# Patient Record
Sex: Female | Born: 1975 | Race: White | Hispanic: No | Marital: Married | Smoking: Never smoker
Health system: Southern US, Community
[De-identification: ages and names within clinical notes are randomized; demographics above are authoritative.]

## PROBLEM LIST (undated history)

## (undated) DIAGNOSIS — B351 Tinea unguium: Secondary | ICD-10-CM

## (undated) DIAGNOSIS — I10 Essential (primary) hypertension: Secondary | ICD-10-CM

## (undated) DIAGNOSIS — E785 Hyperlipidemia, unspecified: Secondary | ICD-10-CM

## (undated) HISTORY — DX: Essential (primary) hypertension: I10

## (undated) HISTORY — DX: Tinea unguium: B35.1

## (undated) HISTORY — DX: Hyperlipidemia, unspecified: E78.5

---

## 2001-12-22 ENCOUNTER — Other Ambulatory Visit: Admission: RE | Admit: 2001-12-22 | Discharge: 2001-12-22 | Payer: Self-pay | Admitting: Obstetrics and Gynecology

## 2004-03-20 ENCOUNTER — Other Ambulatory Visit: Admission: RE | Admit: 2004-03-20 | Discharge: 2004-03-20 | Payer: Self-pay | Admitting: Obstetrics and Gynecology

## 2011-01-04 LAB — HM PAP SMEAR: HM Pap smear: NORMAL

## 2011-04-06 ENCOUNTER — Encounter: Payer: Self-pay | Admitting: Internal Medicine

## 2011-04-06 ENCOUNTER — Ambulatory Visit (INDEPENDENT_AMBULATORY_CARE_PROVIDER_SITE_OTHER): Payer: BC Managed Care – PPO | Admitting: Internal Medicine

## 2011-04-06 VITALS — BP 120/84 | HR 80 | Temp 98.6°F | Ht 64.0 in | Wt 147.0 lb

## 2011-04-06 DIAGNOSIS — L708 Other acne: Secondary | ICD-10-CM

## 2011-04-06 DIAGNOSIS — I10 Essential (primary) hypertension: Secondary | ICD-10-CM | POA: Insufficient documentation

## 2011-04-06 DIAGNOSIS — L709 Acne, unspecified: Secondary | ICD-10-CM | POA: Insufficient documentation

## 2011-04-06 DIAGNOSIS — R197 Diarrhea, unspecified: Secondary | ICD-10-CM

## 2011-04-06 DIAGNOSIS — E785 Hyperlipidemia, unspecified: Secondary | ICD-10-CM | POA: Insufficient documentation

## 2011-04-06 LAB — CBC WITH DIFFERENTIAL/PLATELET
Basophils Absolute: 0 10*3/uL (ref 0.0–0.1)
Basophils Relative: 0 % (ref 0–1)
Eosinophils Absolute: 0.1 10*3/uL (ref 0.0–0.7)
Eosinophils Relative: 2 % (ref 0–5)
HCT: 39.7 % (ref 36.0–46.0)
Hemoglobin: 13.9 g/dL (ref 12.0–15.0)
Lymphocytes Relative: 39 % (ref 12–46)
Lymphs Abs: 2.7 10*3/uL (ref 0.7–4.0)
MCH: 31.4 pg (ref 26.0–34.0)
MCHC: 35 g/dL (ref 30.0–36.0)
MCV: 89.6 fL (ref 78.0–100.0)
Monocytes Absolute: 0.5 10*3/uL (ref 0.1–1.0)
Monocytes Relative: 7 % (ref 3–12)
Neutro Abs: 3.7 10*3/uL (ref 1.7–7.7)
Neutrophils Relative %: 52 % (ref 43–77)
Platelets: 235 10*3/uL (ref 150–400)
RBC: 4.43 MIL/uL (ref 3.87–5.11)
RDW: 12 % (ref 11.5–15.5)
WBC: 7 10*3/uL (ref 4.0–10.5)

## 2011-04-06 LAB — COMPREHENSIVE METABOLIC PANEL
ALT: 18 U/L (ref 0–35)
AST: 27 U/L (ref 0–37)
Albumin: 4.8 g/dL (ref 3.5–5.2)
Alkaline Phosphatase: 48 U/L (ref 39–117)
BUN: 15 mg/dL (ref 6–23)
CO2: 22 mEq/L (ref 19–32)
Calcium: 9.4 mg/dL (ref 8.4–10.5)
Chloride: 101 mEq/L (ref 96–112)
Creat: 0.89 mg/dL (ref 0.50–1.10)
Glucose, Bld: 75 mg/dL (ref 70–99)
Potassium: 3.4 mEq/L — ABNORMAL LOW (ref 3.5–5.3)
Sodium: 137 mEq/L (ref 135–145)
Total Bilirubin: 1.5 mg/dL — ABNORMAL HIGH (ref 0.3–1.2)
Total Protein: 7.2 g/dL (ref 6.0–8.3)

## 2011-04-06 LAB — LIPID PANEL
Cholesterol: 187 mg/dL (ref 0–200)
HDL: 72 mg/dL (ref >39)
LDL Cholesterol: 100 mg/dL — ABNORMAL HIGH (ref 0–99)
Total CHOL/HDL Ratio: 2.6 Ratio
Triglycerides: 75 mg/dL (ref <150)
VLDL: 15 mg/dL (ref 0–40)

## 2011-04-06 MED ORDER — MINOCYCLINE HCL 100 MG PO CAPS: 100.0000 mg | ORAL_CAPSULE | Freq: Every day | ORAL | Status: DC

## 2011-04-06 NOTE — Assessment & Plan Note (Signed)
Patient is on minocycline. Reports good control at this medication. Will continue.

## 2011-04-06 NOTE — Assessment & Plan Note (Signed)
Patient is on hydrochlorothiazide. Blood pressure well-controlled today. We'll check renal function with labs. Followup one month.

## 2011-04-06 NOTE — Assessment & Plan Note (Signed)
Symptoms of diarrhea times one year. No findings such as blood in the stool or abdominal pain to suggest Crohn's or other inflammatory bowel disease. No fever, chills or other symptoms to suggest infectious origin. Suspect IBS. Will send stool for culture. Will send CMP, CBC, and celiac panel. Patient will followup in one month. If lap testing is negative would favor sending her for colonoscopy for further evaluation.

## 2011-04-06 NOTE — Assessment & Plan Note (Signed)
Patient is on Lipitor and Niaspan. Will check lipids and LFTs with labs today. Followup one month.

## 2011-04-06 NOTE — Progress Notes (Signed)
Subjective:    Patient ID: Heather [name], female    DOB: 01-02-1976, 36 y.o.   MRN: [id number]  HPI 36 year old female with history of hypertension and hyperlipidemia presents to establish care. Her primary concern today is a 1 year history of watery diarrhea. She reports that she has episodes of diarrhea on a daily basis. She has not been able to determine a food triggers that leads to these episodes. She describes cramping that occurs before the diarrhea. She denies any blood in her stool. She denies any rectal pain. She denies any nausea or vomiting. She denies any fever, chills. She has not experienced weight loss with her diarrhea. She has never had colonoscopy or other evaluation.  In regards to her history of hypertension hyperlipidemia she reports full compliance with her medications. She denies any noted side effects from the medicines. She notes that she is due to have her cholesterol rechecked.  In regards to history of acne, she reports good control of her symptoms with the use of minocycline. She would like to continue this medication.  Outpatient Encounter Prescriptions as of 04/06/2011  Medication Sig Dispense Refill  . atorvastatin (LIPITOR) 20 MG tablet Take 20 mg by mouth daily.      . Calcium Carbonate (CALTRATE 600 PO) Take by mouth daily.      Marland Kitchen FLUoxetine (PROZAC) 20 MG capsule Take 20 mg by mouth. Daily prior to mensus      . hydrochlorothiazide (HYDRODIURIL) 25 MG tablet Take 25 mg by mouth daily.      . minocycline (MINOCIN,DYNACIN) 100 MG capsule Take 1 capsule (100 mg total) by mouth daily.  90 capsule  1  . niacin (NIASPAN) 500 MG CR tablet Take 500 mg by mouth at bedtime.      Marland Kitchen DISCONTD: minocycline (MINOCIN,DYNACIN) 100 MG capsule Take 100 mg by mouth daily.        Review of Systems  Constitutional: Negative for fever, chills, appetite change, fatigue and unexpected weight change.  HENT: Negative for ear pain, congestion, sore throat, trouble swallowing,  neck pain, voice change and sinus pressure.   Eyes: Negative for visual disturbance.  Respiratory: Negative for cough, shortness of breath, wheezing and stridor.   Cardiovascular: Negative for chest pain, palpitations and leg swelling.  Gastrointestinal: Positive for abdominal pain and diarrhea. Negative for nausea, vomiting, constipation, blood in stool, abdominal distention and anal bleeding.  Genitourinary: Negative for dysuria and flank pain.  Musculoskeletal: Negative for myalgias, arthralgias and gait problem.  Skin: Positive for rash. Negative for color change.  Neurological: Negative for dizziness and headaches.  Hematological: Negative for adenopathy. Does not bruise/bleed easily.  Psychiatric/Behavioral: Negative for suicidal ideas, sleep disturbance and dysphoric mood. The patient is not nervous/anxious.    BP 120/84  Pulse 80  Temp 98.6 F (37 C)  Ht 5\' 4"  (1.626 m)  Wt 147 lb (66.679 kg)  BMI 25.23 kg/m2  SpO2 100%  LMP 03/04/2011     Objective:   Physical Exam  Constitutional: She is oriented to person, place, and time. She appears well-developed and well-nourished. No distress.  HENT:  Head: Normocephalic and atraumatic.  Right Ear: External ear normal.  Left Ear: External ear normal.  [name]: [name] normal.  Mouth/Throat: Oropharynx is clear and moist. No oropharyngeal exudate.  Eyes: Conjunctivae are normal. Pupils are equal, round, and reactive to light. Right eye exhibits no discharge. Left eye exhibits no discharge. No scleral icterus.  Neck: Normal range of motion. Neck supple. No tracheal  deviation present. No thyromegaly present.  Cardiovascular: Normal rate, regular rhythm, normal heart sounds and intact distal pulses.  Exam reveals no gallop and no friction rub.   No murmur heard. Pulmonary/Chest: Effort normal and breath sounds normal. No respiratory distress. She has no wheezes. She has no rales. She exhibits no tenderness.  Abdominal: Soft. Bowel sounds  are normal. She exhibits no distension and no mass. There is no tenderness. There is no rebound and no guarding.  Musculoskeletal: Normal range of motion. She exhibits no edema and no tenderness.  Lymphadenopathy:    She has no cervical adenopathy.  Neurological: She is alert and oriented to person, place, and time. No cranial nerve deficit. She exhibits normal muscle tone. Coordination normal.  Skin: Skin is warm and dry. No rash noted. She is not diaphoretic. No erythema. No pallor.  Psychiatric: She has a normal mood and affect. Her behavior is normal. Judgment and thought content normal.          Assessment & Plan:

## 2011-04-07 ENCOUNTER — Encounter: Payer: Self-pay | Admitting: Internal Medicine

## 2011-04-07 DIAGNOSIS — R17 Unspecified jaundice: Secondary | ICD-10-CM

## 2011-04-07 DIAGNOSIS — R197 Diarrhea, unspecified: Secondary | ICD-10-CM

## 2011-04-09 LAB — GLIADIN ANTIBODIES, SERUM
Gliadin IgA: 40.8 U/mL — ABNORMAL HIGH (ref <20)
Gliadin IgG: 3.7 U/mL (ref <20)

## 2011-04-09 NOTE — Progress Notes (Signed)
Addended by: Baldomero Lamy on: 04/09/2011 01:12 PM   Modules accepted: Orders

## 2011-04-10 ENCOUNTER — Encounter: Payer: Self-pay | Admitting: Internal Medicine

## 2011-04-10 DIAGNOSIS — R197 Diarrhea, unspecified: Secondary | ICD-10-CM

## 2011-04-10 LAB — RETICULIN ANTIBODIES, IGA W TITER: Reticulin Ab, IgA: NEGATIVE

## 2011-04-13 ENCOUNTER — Ambulatory Visit: Payer: Self-pay | Admitting: Internal Medicine

## 2011-04-13 LAB — STOOL CULTURE

## 2011-04-17 ENCOUNTER — Telehealth: Payer: Self-pay | Admitting: Internal Medicine

## 2011-04-17 DIAGNOSIS — N1330 Unspecified hydronephrosis: Secondary | ICD-10-CM

## 2011-04-17 NOTE — Telephone Encounter (Signed)
US abdomen showed no abnormality of gallbladder, pancreas or liver.  They did note some fluid around the left kidney.  This is likely a chronic finding, however I would like to get renal US to make sure no abnormality of the ureter or bladder.

## 2011-04-18 ENCOUNTER — Encounter: Payer: Self-pay | Admitting: Internal Medicine

## 2011-04-18 NOTE — Telephone Encounter (Signed)
Patient informed, she is scheduled for u/s tomorrow

## 2011-04-19 ENCOUNTER — Ambulatory Visit: Payer: Self-pay | Admitting: Internal Medicine

## 2011-04-20 ENCOUNTER — Encounter: Payer: Self-pay | Admitting: Internal Medicine

## 2011-04-22 ENCOUNTER — Telehealth: Payer: Self-pay | Admitting: Internal Medicine

## 2011-04-22 NOTE — Telephone Encounter (Signed)
Renal U/S normal

## 2011-04-27 ENCOUNTER — Encounter: Payer: Self-pay | Admitting: Internal Medicine

## 2011-04-28 ENCOUNTER — Encounter: Payer: Self-pay | Admitting: Internal Medicine

## 2011-05-09 ENCOUNTER — Ambulatory Visit: Payer: BC Managed Care – PPO | Admitting: Internal Medicine

## 2011-05-11 ENCOUNTER — Ambulatory Visit (INDEPENDENT_AMBULATORY_CARE_PROVIDER_SITE_OTHER): Payer: BC Managed Care – PPO | Admitting: Internal Medicine

## 2011-05-11 ENCOUNTER — Encounter: Payer: Self-pay | Admitting: Internal Medicine

## 2011-05-11 ENCOUNTER — Telehealth: Payer: Self-pay | Admitting: Internal Medicine

## 2011-05-11 DIAGNOSIS — G43909 Migraine, unspecified, not intractable, without status migrainosus: Secondary | ICD-10-CM

## 2011-05-11 MED ORDER — ELETRIPTAN HYDROBROMIDE 40 MG PO TABS: 40.0000 mg | ORAL_TABLET | ORAL | Status: DC | PRN

## 2011-05-11 NOTE — Assessment & Plan Note (Signed)
Symptoms consistent with migraine. Would like to give IM Phenergan, however patient drove herself today and does not have other transportation home. Will try adding Relpax. Sample given and prescription called in. Patient will call this afternoon if there is no improvement.

## 2011-05-11 NOTE — Progress Notes (Signed)
  Subjective:    Patient ID: Heather [name], female    DOB: 1976-02-10, 36 y.o.   MRN: [id number]  HPI 36 year old female presents for acute visit complaining of headache. She notes that the headache started yesterday. It is located on the right side of her face, mostly around her right eye. It is consistent with her previous migraines which typically occur before her menses. The headache is associated with phono and photophobia. It is also associated with nausea. She has not vomited. Over the last 2 days, she has taken ibuprofen, Aleve, BC powder, and Tylenol with no improvement. She has never used other medications for headache in the past.  Outpatient Encounter Prescriptions as of 05/11/2011  Medication Sig Dispense Refill  . atorvastatin (LIPITOR) 20 MG tablet Take 20 mg by mouth daily.      . Calcium Carbonate (CALTRATE 600 PO) Take by mouth daily.      Marland Kitchen FLUoxetine (PROZAC) 20 MG capsule Take 20 mg by mouth. Daily prior to mensus      . hydrochlorothiazide (HYDRODIURIL) 25 MG tablet Take 25 mg by mouth daily.      . metoprolol succinate (TOPROL-XL) 50 MG 24 hr tablet Take 50 mg by mouth daily. Take with or immediately following a meal.      . minocycline (MINOCIN,DYNACIN) 100 MG capsule Take 1 capsule (100 mg total) by mouth daily.  90 capsule  1  . niacin (NIASPAN) 500 MG CR tablet Take 500 mg by mouth at bedtime.      Marland Kitchen eletriptan (RELPAX) 40 MG tablet One tablet by mouth as needed for migraine headache.  If the headache improves and then returns, dose may be repeated after 2 hours have elapsed since first dose (do not exceed 80 mg per day). may repeat in 2 hours if necessary  10 tablet  0    Review of Systems  Constitutional: Positive for fatigue. Negative for fever and chills.  HENT: Negative for neck pain.   Eyes: Positive for photophobia.  Respiratory: Negative for shortness of breath.   Cardiovascular: Negative for chest pain and palpitations.  Gastrointestinal: Positive for  nausea. Negative for vomiting and abdominal pain.  Neurological: Positive for headaches.   BP 120/70  Pulse 73  Temp(Src) 98 F (36.7 C) (Oral)  Wt 146 lb (66.225 kg)  SpO2 100%     Objective:   Physical Exam  Constitutional: She is oriented to person, place, and time. She appears well-developed and well-nourished. No distress.  HENT:  Head: Normocephalic and atraumatic.  Right Ear: External ear normal.  Left Ear: External ear normal.  [name]: [name] normal.  Mouth/Throat: Oropharynx is clear and moist.  Eyes: Conjunctivae are normal. Pupils are equal, round, and reactive to light. Right eye exhibits no discharge. Left eye exhibits no discharge. No scleral icterus.  Neck: Normal range of motion. Neck supple. No tracheal deviation present. No thyromegaly present.  Pulmonary/Chest: Effort normal.  Musculoskeletal: Normal range of motion. She exhibits no edema and no tenderness.  Lymphadenopathy:    She has no cervical adenopathy.  Neurological: She is alert and oriented to person, place, and time. No cranial nerve deficit. She exhibits normal muscle tone. Coordination normal.  Skin: Skin is warm and dry. No rash noted. She is not diaphoretic. No erythema. No pallor.  Psychiatric: She has a normal mood and affect. Her behavior is normal. Judgment and thought content normal.          Assessment & Plan:

## 2011-05-11 NOTE — Telephone Encounter (Signed)
Follow up.

## 2011-06-20 ENCOUNTER — Ambulatory Visit (INDEPENDENT_AMBULATORY_CARE_PROVIDER_SITE_OTHER): Payer: BC Managed Care – PPO | Admitting: Internal Medicine

## 2011-06-20 ENCOUNTER — Encounter: Payer: Self-pay | Admitting: Internal Medicine

## 2011-06-20 VITALS — BP 122/72 | HR 101 | Temp 98.3°F | Resp 16 | Wt 147.0 lb

## 2011-06-20 DIAGNOSIS — R197 Diarrhea, unspecified: Secondary | ICD-10-CM

## 2011-06-20 DIAGNOSIS — G43909 Migraine, unspecified, not intractable, without status migrainosus: Secondary | ICD-10-CM

## 2011-06-20 DIAGNOSIS — Z87898 Personal history of other specified conditions: Secondary | ICD-10-CM | POA: Insufficient documentation

## 2011-06-20 DIAGNOSIS — R635 Abnormal weight gain: Secondary | ICD-10-CM

## 2011-06-20 NOTE — Progress Notes (Signed)
Subjective:    Patient ID: Heather [name], female    DOB: 09-12-1975, 36 y.o.   MRN: [id number]  HPI 36 year old female with history of chronic diarrhea and migraines presents for followup. In regards to her diarrhea, she reports her symptoms completely resolved with removing gluten from her diet. She was seen by GI physician and had endoscopy. Biopsies are pending. She denies any current abdominal pain, nausea, diarrhea.  In regards to her migraines, she reports complete resolution of symptoms after using Relpax. Since her last visit, she has not had any recurrence of migraine headaches.  She is concerned today about difficulty losing weight. She reports she is counting her calories and exercising up to twice daily with 45 minutes spent on the treadmill or elliptical followed by circuit training. She notes some intolerance to cold temperatures. She also occasionally now has constipation.  Outpatient Encounter Prescriptions as of 06/20/2011  Medication Sig Dispense Refill  . atorvastatin (LIPITOR) 20 MG tablet Take 20 mg by mouth daily.      . Calcium Carbonate (CALTRATE 600 PO) Take by mouth daily.      Marland Kitchen eletriptan (RELPAX) 40 MG tablet One tablet by mouth as needed for migraine headache.  If the headache improves and then returns, dose may be repeated after 2 hours have elapsed since first dose (do not exceed 80 mg per day). may repeat in 2 hours if necessary  10 tablet  0  . FLUoxetine (PROZAC) 20 MG capsule Take 20 mg by mouth. Daily prior to mensus      . hydrochlorothiazide (HYDRODIURIL) 25 MG tablet Take 25 mg by mouth daily.      . metoprolol succinate (TOPROL-XL) 50 MG 24 hr tablet Take 50 mg by mouth daily. Take with or immediately following a meal.      . minocycline (MINOCIN,DYNACIN) 100 MG capsule Take 1 capsule (100 mg total) by mouth daily.  90 capsule  1  . niacin (NIASPAN) 500 MG CR tablet Take 500 mg by mouth at bedtime.       BP 122/72  Pulse 101  Temp(Src) 98.3 F (36.8  C) (Oral)  Resp 16  Wt 147 lb (66.679 kg)  SpO2 94%  LMP 05/23/2011  Review of Systems  Constitutional: Negative for fever, chills, appetite change, fatigue and unexpected weight change.  HENT: Negative for ear pain, congestion, sore throat, trouble swallowing, neck pain, voice change and sinus pressure.   Eyes: Negative for visual disturbance.  Respiratory: Negative for cough, shortness of breath, wheezing and stridor.   Cardiovascular: Negative for chest pain, palpitations and leg swelling.  Gastrointestinal: Positive for diarrhea and constipation. Negative for nausea, vomiting, abdominal pain, blood in stool, abdominal distention and anal bleeding.  Genitourinary: Negative for dysuria and flank pain.  Musculoskeletal: Negative for myalgias, arthralgias and gait problem.  Skin: Negative for color change and rash.  Neurological: Negative for dizziness and headaches.  Hematological: Negative for adenopathy. Does not bruise/bleed easily.  Psychiatric/Behavioral: Negative for suicidal ideas, sleep disturbance and dysphoric mood. The patient is not nervous/anxious.        Objective:   Physical Exam  Constitutional: She is oriented to person, place, and time. She appears well-developed and well-nourished. No distress.  HENT:  Head: Normocephalic and atraumatic.  Right Ear: External ear normal.  Left Ear: External ear normal.  [name]: [name] normal.  Mouth/Throat: Oropharynx is clear and moist. No oropharyngeal exudate.  Eyes: Conjunctivae are normal. Pupils are equal, round, and reactive to light. Right eye  exhibits no discharge. Left eye exhibits no discharge. No scleral icterus.  Neck: Normal range of motion. Neck supple. No tracheal deviation present. No thyromegaly present.  Cardiovascular: Normal rate, regular rhythm, normal heart sounds and intact distal pulses.  Exam reveals no gallop and no friction rub.   No murmur heard. Pulmonary/Chest: Effort normal and breath sounds normal.  No respiratory distress. She has no wheezes. She has no rales. She exhibits no tenderness.  Abdominal: Soft. Bowel sounds are normal. She exhibits no distension and no mass. There is no tenderness. There is no guarding.  Musculoskeletal: Normal range of motion. She exhibits no edema and no tenderness.  Lymphadenopathy:    She has no cervical adenopathy.  Neurological: She is alert and oriented to person, place, and time. No cranial nerve deficit. She exhibits normal muscle tone. Coordination normal.  Skin: Skin is warm and dry. No rash noted. She is not diaphoretic. No erythema. No pallor.  Psychiatric: She has a normal mood and affect. Her behavior is normal. Judgment and thought content normal.          Assessment & Plan:

## 2011-06-20 NOTE — Assessment & Plan Note (Signed)
Symptoms resolved with removing gluten from diet. Biopsy after endoscopy is pending. Patient will e-mail with update when biopsy results are back.

## 2011-06-20 NOTE — Assessment & Plan Note (Signed)
Significant improvement with use of Relpax. No further episodes since last visit. We'll continue to monitor.

## 2011-06-20 NOTE — Assessment & Plan Note (Signed)
Patient reports difficulty losing weight despite counting calories and exercising regularly. Will check thyroid function with labs today.

## 2011-06-21 LAB — TSH: TSH: 1.27 u[IU]/mL (ref 0.35–5.50)

## 2011-06-26 ENCOUNTER — Encounter: Payer: Self-pay | Admitting: Internal Medicine

## 2011-07-18 ENCOUNTER — Encounter: Payer: Self-pay | Admitting: Internal Medicine

## 2011-08-18 ENCOUNTER — Encounter: Payer: Self-pay | Admitting: Internal Medicine

## 2011-09-12 ENCOUNTER — Other Ambulatory Visit: Payer: Self-pay | Admitting: Internal Medicine

## 2011-10-03 ENCOUNTER — Other Ambulatory Visit: Payer: Self-pay | Admitting: Internal Medicine

## 2011-10-04 ENCOUNTER — Other Ambulatory Visit: Payer: Self-pay | Admitting: *Deleted

## 2011-10-04 MED ORDER — HYDROCHLOROTHIAZIDE 25 MG PO TABS: 25.0000 mg | ORAL_TABLET | Freq: Every day | ORAL | Status: DC

## 2011-11-22 ENCOUNTER — Encounter: Payer: Self-pay | Admitting: Internal Medicine

## 2011-11-22 ENCOUNTER — Ambulatory Visit (INDEPENDENT_AMBULATORY_CARE_PROVIDER_SITE_OTHER): Payer: BC Managed Care – PPO | Admitting: Internal Medicine

## 2011-11-22 VITALS — BP 120/80 | HR 60 | Temp 98.2°F | Ht 64.0 in | Wt 144.5 lb

## 2011-11-22 DIAGNOSIS — M5430 Sciatica, unspecified side: Secondary | ICD-10-CM

## 2011-11-22 DIAGNOSIS — M5431 Sciatica, right side: Secondary | ICD-10-CM | POA: Insufficient documentation

## 2011-11-22 MED ORDER — PREDNISONE (PAK) 10 MG PO TABS: ORAL_TABLET | ORAL | Status: DC

## 2011-11-22 MED ORDER — HYDROCODONE-ACETAMINOPHEN 5-500 MG PO TABS: 1.0000 | ORAL_TABLET | Freq: Three times a day (TID) | ORAL | Status: DC | PRN

## 2011-11-22 NOTE — Assessment & Plan Note (Signed)
Symptoms consistent with right sciatic pain. Will treat with prednisone taper and hydrocodone as needed for severe pain. Patient will call if symptoms persistent over the next 48 hours. If symptoms are persisting, would favor referral to orthopedic surgery or sports medicine for local steroid injection.

## 2011-11-22 NOTE — Progress Notes (Signed)
  Subjective:    Patient ID: Heather [name], female    DOB: 06-Jun-1975, 36 y.o.   MRN: [id number]  HPI 36 year old female presents for acute visit complaining of severe right lower back and hip pain radiating to the back of her right leg. Symptoms first began 2 days ago after using a new machine at her gym. She tried using muscle relaxers she had at home from previous episode of right sciatic pain. She reports no improvement with this. Pain is described as severe and it limits ability to ambulate. She denies weakness in her right leg. She denies fever or chills. She denies swelling in her leg.  Outpatient Encounter Prescriptions as of 11/22/2011  Medication Sig Dispense Refill  . atorvastatin (LIPITOR) 20 MG tablet Take 20 mg by mouth daily.      . Calcium Carbonate (CALTRATE 600 PO) Take by mouth daily.      Marland Kitchen eletriptan (RELPAX) 40 MG tablet One tablet by mouth as needed for migraine headache.  If the headache improves and then returns, dose may be repeated after 2 hours have elapsed since first dose (do not exceed 80 mg per day). may repeat in 2 hours if necessary  10 tablet  0  . FLUoxetine (PROZAC) 20 MG capsule Take 20 mg by mouth. Daily prior to mensus      . hydrochlorothiazide (HYDRODIURIL) 25 MG tablet Take 1 tablet (25 mg total) by mouth daily.  30 tablet  6  . metoprolol succinate (TOPROL-XL) 50 MG 24 hr tablet TAKE 1 TABELT BY MOUTH EVERY DAY  30 tablet  6  . minocycline (MINOCIN,DYNACIN) 100 MG capsule TAKE 1 CAPSULE (100 MG TOTAL) BY MOUTH DAILY.  90 capsule  1  . niacin (NIASPAN) 500 MG CR tablet Take 500 mg by mouth at bedtime.      Marland Kitchen HYDROcodone-acetaminophen (VICODIN) 5-500 MG per tablet Take 1 tablet by mouth every 8 (eight) hours as needed for pain.  60 tablet  1  . predniSONE (STERAPRED UNI-PAK) 10 MG tablet Take 60mg  day 1 then taper by 10mg  daily  21 tablet  0   BP 120/80  Pulse 60  Temp 98.2 F (36.8 C) (Oral)  Ht 5\' 4"  (1.626 m)  Wt 144 lb 8 oz (65.545 kg)  BMI  24.80 kg/m2  SpO2 98%  LMP 11/13/2011  Review of Systems  Constitutional: Negative for fever, chills and fatigue.  Respiratory: Negative for shortness of breath.   Cardiovascular: Negative for chest pain.  Gastrointestinal: Negative for abdominal pain.  Musculoskeletal: Positive for myalgias and arthralgias. Negative for joint swelling.       Objective:   Physical Exam  Constitutional: She is oriented to person, place, and time. She appears well-developed and well-nourished. No distress.  HENT:  Head: Normocephalic and atraumatic.  Eyes: Pupils are equal, round, and reactive to light.  Neck: Normal range of motion.  Pulmonary/Chest: Effort normal.  Musculoskeletal:       Right hip: She exhibits decreased range of motion and tenderness.       Legs: Neurological: She is alert and oriented to person, place, and time. She has normal reflexes.  Skin: She is not diaphoretic.  Psychiatric: She has a normal mood and affect. Her behavior is normal. Judgment and thought content normal.          Assessment & Plan:

## 2011-12-09 ENCOUNTER — Other Ambulatory Visit: Payer: Self-pay | Admitting: Internal Medicine

## 2012-02-28 ENCOUNTER — Encounter: Payer: Self-pay | Admitting: Internal Medicine

## 2012-02-28 ENCOUNTER — Ambulatory Visit (INDEPENDENT_AMBULATORY_CARE_PROVIDER_SITE_OTHER): Payer: BC Managed Care – PPO | Admitting: Adult Health

## 2012-02-28 ENCOUNTER — Encounter: Payer: Self-pay | Admitting: Adult Health

## 2012-02-28 VITALS — BP 100/70 | HR 64 | Temp 98.8°F | Resp 14 | Ht 64.0 in | Wt 147.8 lb

## 2012-02-28 DIAGNOSIS — H60399 Other infective otitis externa, unspecified ear: Secondary | ICD-10-CM

## 2012-02-28 DIAGNOSIS — H6090 Unspecified otitis externa, unspecified ear: Secondary | ICD-10-CM

## 2012-02-28 DIAGNOSIS — J329 Chronic sinusitis, unspecified: Secondary | ICD-10-CM

## 2012-02-28 MED ORDER — CIPROFLOXACIN-DEXAMETHASONE 0.3-0.1 % OT SUSP: 4.0000 [drp] | Freq: Two times a day (BID) | OTIC | Status: DC

## 2012-02-28 MED ORDER — ANTIPYRINE-BENZOCAINE 5.4-1.4 % OT SOLN: 3.0000 [drp] | OTIC | Status: DC | PRN

## 2012-02-28 MED ORDER — FLUTICASONE PROPIONATE 50 MCG/ACT NA SUSP: 2.0000 | Freq: Every day | NASAL | Status: DC

## 2012-02-28 NOTE — Progress Notes (Signed)
  Subjective:    Patient ID: Heather [name], female    DOB: October 27, 1975, 37 y.o.   MRN: [id number]  HPI  Patient presents to clinic with 2 day history of sinus pressure, mainly on the right side and right ear pain. Secretions are clear. She denies fever, chills. Reports cough started this morning. Pt has tried several OTC products (Musinex, AlkaSeltzer Severe Sinus) without any relief.  Current Outpatient Prescriptions on File Prior to Visit  Medication Sig Dispense Refill  . atorvastatin (LIPITOR) 20 MG tablet TAKE 1 TABLET BY MOUTH AT BEDTIME  30 tablet  3  . Calcium Carbonate (CALTRATE 600 PO) Take by mouth daily.      Marland Kitchen eletriptan (RELPAX) 40 MG tablet One tablet by mouth as needed for migraine headache.  If the headache improves and then returns, dose may be repeated after 2 hours have elapsed since first dose (do not exceed 80 mg per day). may repeat in 2 hours if necessary  10 tablet  0  . FLUoxetine (PROZAC) 20 MG capsule Take 20 mg by mouth. Daily prior to mensus      . hydrochlorothiazide (HYDRODIURIL) 25 MG tablet Take 1 tablet (25 mg total) by mouth daily.  30 tablet  6  . HYDROcodone-acetaminophen (VICODIN) 5-500 MG per tablet Take 1 tablet by mouth every 8 (eight) hours as needed for pain.  60 tablet  1  . metoprolol succinate (TOPROL-XL) 50 MG 24 hr tablet TAKE 1 TABELT BY MOUTH EVERY DAY  30 tablet  6  . minocycline (MINOCIN,DYNACIN) 100 MG capsule TAKE 1 CAPSULE (100 MG TOTAL) BY MOUTH DAILY.  90 capsule  1  . fluticasone (FLONASE) 50 MCG/ACT nasal spray Place 2 sprays into the [name] daily.  16 g  6      Review of Systems  Constitutional: Negative for fever and chills.  HENT: Positive for ear pain, congestion, rhinorrhea, postnasal drip and sinus pressure. Negative for sore throat.   Eyes: Negative.   Respiratory: Positive for cough. Negative for shortness of breath and wheezing.   Cardiovascular: Negative.   Genitourinary: Negative.   Neurological: Positive for  light-headedness.  Psychiatric/Behavioral: Negative.     BP 100/70  Pulse 64  Temp 98.8 F (37.1 C) (Oral)  Resp 14  Ht 5\' 4"  (1.626 m)  Wt 147 lb 12 oz (67.019 kg)  BMI 25.36 kg/m2  SpO2 99%  LMP 02/19/2012     Objective:   Physical Exam  Constitutional: She is oriented to person, place, and time. She appears well-developed and well-nourished. No distress.  HENT:  Head: Normocephalic.  Left Ear: External ear normal.  [name]: [name] normal.  Mouth/Throat: No oropharyngeal exudate.       Right ear painful tragus and pinna when pulled. Right external ear canal erythematous and with mild inflammation. Right TM grey without any fluid noted.  Neck: No tracheal deviation present.  Cardiovascular: Normal rate, regular rhythm and normal heart sounds.   No murmur heard. Pulmonary/Chest: Effort normal and breath sounds normal. She has no wheezes. She has no rales.  Lymphadenopathy:    She has no cervical adenopathy.  Neurological: She is alert and oriented to person, place, and time. Coordination normal.  Skin: Skin is warm and dry. No rash noted.  Psychiatric: She has a normal mood and affect. Her behavior is normal. Judgment and thought content normal.        Assessment & Plan:

## 2012-02-28 NOTE — Assessment & Plan Note (Signed)
Painful tragus and pinna on the right ear. Erythema and mild inflammation noted on external canal. TM visualized and WNL. Ciprodex antibacterial drops for right ear. Auralgan drops for pain. Tylenol or Advil for discomfort. RTC if no improvement within 3-4 days.

## 2012-02-28 NOTE — Assessment & Plan Note (Signed)
Symptoms suggest viral in nature. Start flonase nasal spray. Flush sinuses with saline spray. Continue to take tylenol or ibuprofen for general discomfort.

## 2012-02-28 NOTE — Patient Instructions (Addendum)
  You have a sinus infection. Start using the flonase 2 sprays in each nostril once daily.  Also flush your sinuses with saline.  Use the ear drops for pain.  If your secretions change in color or you develop a fever, please call.  You can use tylenol or ibuprofen to help with the discomfort.  Call if your symptoms do not improve within 3-4 days.

## 2012-03-04 ENCOUNTER — Encounter: Payer: Self-pay | Admitting: Internal Medicine

## 2012-04-05 ENCOUNTER — Other Ambulatory Visit: Payer: Self-pay

## 2012-04-07 ENCOUNTER — Other Ambulatory Visit: Payer: Self-pay | Admitting: Internal Medicine

## 2012-04-07 NOTE — Telephone Encounter (Signed)
Received refill request electronically. Is it okay to refill medication? 

## 2012-04-09 ENCOUNTER — Other Ambulatory Visit: Payer: Self-pay | Admitting: Internal Medicine

## 2012-05-01 ENCOUNTER — Other Ambulatory Visit: Payer: Self-pay | Admitting: Internal Medicine

## 2012-05-19 ENCOUNTER — Encounter: Payer: Self-pay | Admitting: Internal Medicine

## 2012-05-19 ENCOUNTER — Ambulatory Visit: Payer: BC Managed Care – PPO | Admitting: Adult Health

## 2012-05-21 ENCOUNTER — Ambulatory Visit: Payer: BC Managed Care – PPO | Admitting: Adult Health

## 2012-05-22 ENCOUNTER — Encounter: Payer: Self-pay | Admitting: Internal Medicine

## 2012-05-22 ENCOUNTER — Ambulatory Visit (INDEPENDENT_AMBULATORY_CARE_PROVIDER_SITE_OTHER): Payer: BC Managed Care – PPO | Admitting: Adult Health

## 2012-05-22 ENCOUNTER — Encounter: Payer: Self-pay | Admitting: Adult Health

## 2012-05-22 VITALS — BP 104/73 | HR 71 | Temp 98.1°F | Resp 16 | Ht 64.0 in | Wt 146.0 lb

## 2012-05-22 DIAGNOSIS — B351 Tinea unguium: Secondary | ICD-10-CM

## 2012-05-22 DIAGNOSIS — Z79899 Other long term (current) drug therapy: Secondary | ICD-10-CM

## 2012-05-22 LAB — COMPREHENSIVE METABOLIC PANEL
ALT: 22 U/L (ref 0–35)
AST: 25 U/L (ref 0–37)
Albumin: 4.1 g/dL (ref 3.5–5.2)
Alkaline Phosphatase: 53 U/L (ref 39–117)
BUN: 14 mg/dL (ref 6–23)
CO2: 30 mEq/L (ref 19–32)
Calcium: 8.8 mg/dL (ref 8.4–10.5)
Chloride: 103 mEq/L (ref 96–112)
Creatinine, Ser: 0.9 mg/dL (ref 0.4–1.2)
GFR: 77.39 mL/min (ref >60.00)
Glucose, Bld: 98 mg/dL (ref 70–99)
Potassium: 3.2 mEq/L — ABNORMAL LOW (ref 3.5–5.1)
Sodium: 137 mEq/L (ref 135–145)
Total Bilirubin: 1.4 mg/dL — ABNORMAL HIGH (ref 0.3–1.2)
Total Protein: 6.9 g/dL (ref 6.0–8.3)

## 2012-05-22 NOTE — Assessment & Plan Note (Addendum)
Check creatinine and LFTs. If normal, start Lamisil x 12 weeks.

## 2012-05-22 NOTE — Progress Notes (Signed)
  Subjective:    Patient ID: Heather [name], female    DOB: [birth year], 37 y.o.   MRN: [id number]  HPI  Patient presents to clinic with c/o thinking she has toe nail fungus involving several toenails. She reports she has been trying different types of OTC remedies including vicks vapor rub, Listerine, etc.    Review of Systems  Skin: Negative for rash.       Toenails thick, yellow and discolored   BP 104/73  Pulse 71  Temp(Src) 98.1 F (36.7 C) (Oral)  Resp 16  Ht 5\' 4"  (1.626 m)  Wt 146 lb (66.225 kg)  BMI 25.05 kg/m2  SpO2 100%  LMP 05/21/2012     Objective:   Physical Exam  Constitutional: She is oriented to person, place, and time.  Cardiovascular: Normal rate.   Pulmonary/Chest: Effort normal.  Neurological: She is alert and oriented to person, place, and time.  Skin: Skin is warm and dry.  Right great toe with appearance of onychomycosis as well as the left pinky. Skin in between toes is intact without evidence of fungus.  Psychiatric: She has a normal mood and affect. Her behavior is normal. Judgment and thought content normal.       Assessment & Plan:

## 2012-05-23 LAB — KOH PREP: RESULT - KOH: NONE SEEN

## 2012-06-26 ENCOUNTER — Ambulatory Visit (INDEPENDENT_AMBULATORY_CARE_PROVIDER_SITE_OTHER): Payer: BC Managed Care – PPO | Admitting: Adult Health

## 2012-06-26 ENCOUNTER — Encounter: Payer: Self-pay | Admitting: Adult Health

## 2012-06-26 VITALS — BP 102/62 | HR 65 | Temp 98.1°F | Resp 14 | Wt 151.0 lb

## 2012-06-26 DIAGNOSIS — J329 Chronic sinusitis, unspecified: Secondary | ICD-10-CM

## 2012-06-26 MED ORDER — GUAIFENESIN-CODEINE 100-10 MG/5ML PO SYRP: 5.0000 mL | ORAL_SOLUTION | Freq: Three times a day (TID) | ORAL | Status: DC | PRN

## 2012-06-26 MED ORDER — AZITHROMYCIN 250 MG PO TABS: ORAL_TABLET | ORAL | Status: DC

## 2012-06-26 NOTE — Patient Instructions (Addendum)
  You have a combination of sinusitis with bronchitis.  Please start your antibiotic today.  I have also ordered cough medicine. This medication may make you sleepy.  Return to work on Monday.  Stay hydrated - drink plenty of fluids.  If you are not better by Monday please call

## 2012-06-26 NOTE — Assessment & Plan Note (Signed)
Start azithromycin Robitussin AC for severe cough. RTC if symptoms are not improved by Monday.

## 2012-06-26 NOTE — Progress Notes (Signed)
  Subjective:    Patient ID: Heather Heather [name], female    DOB: [birth year], 37 y.o.   MRN: [id number]  HPI  Patient presents with upper respiratory symptoms that began this past Sunday - severe cough that is keeping her up at night, fever, chills, sore throat (this has subsided), sinus drainage and congestion. She has tried multiple over-the-counter medications without any improvement. 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   Current Outpatient Prescriptions on File Prior to Visit  Medication Sig Dispense Refill  . Calcium Carbonate (CALTRATE 600 PO) Take by mouth daily.      . hydrochlorothiazide (HYDRODIURIL) 25 MG tablet TAKE 1 TABLET (25 MG TOTAL) BY MOUTH DAILY.  30 tablet  6  . metoprolol succinate (TOPROL-XL) 50 MG 24 hr tablet TAKE 1 TABELT BY MOUTH EVERY DAY  30 tablet  6  . minocycline (MINOCIN,DYNACIN) 100 MG capsule TAKE 1 CAPSULE (100 MG TOTAL) BY MOUTH DAILY.  90 capsule  1  . RELPAX 40 MG tablet TAKE 1 TABLET BY MOUTH AS NEEDED FOR MIGRAINE.MAY REAPEAT IN 2 HOURS.DO NOT EXCEED 2 TABS IN 24 HRS  10 tablet  0  . atorvastatin (LIPITOR) 20 MG tablet TAKE 1 TABLET BY MOUTH AT BEDTIME  30 tablet  3  . FLUoxetine (PROZAC) 20 MG capsule Take 20 mg by mouth. Daily prior to mensus      . fluticasone (FLONASE) 50 MCG/ACT nasal spray Place 2 sprays into the Heather [name] daily.  16 g  6  . HYDROcodone-acetaminophen (VICODIN) 5-500 MG per tablet Take 1 tablet by mouth every 8 (eight) hours as needed for pain.  60 tablet  1   No current facility-administered medications on file prior to visit.     Review of Systems  HENT: Positive for ear pain, congestion, postnasal drip and sinus pressure. Negative for sore throat and trouble swallowing.   Respiratory: Positive for cough and shortness of breath. Negative for wheezing.   Cardiovascular: Negative for chest pain and palpitations.   BP 102/62  Pulse 65  Temp(Src) 98.1 F (36.7 C) (Oral)  Resp 14  Wt 151 lb (68.493 kg)   BMI 25.91 kg/m2  SpO2 98%    Objective:   Physical Exam  Constitutional: She is oriented to person, place, and time. She appears well-developed and well-nourished. No distress.  HENT:  Head: Normocephalic and atraumatic.  Mouth/Throat: No oropharyngeal exudate.  Pharyngeal erythema  Cardiovascular: Normal rate, regular rhythm, normal heart sounds and intact distal pulses.   Pulmonary/Chest: Effort normal and breath sounds normal. No respiratory distress. She has no wheezes. She has no rales.  Lymphadenopathy:    She has cervical adenopathy.  Neurological: She is alert and oriented to person, place, and time.  Skin: Skin is warm and dry.  Psychiatric: She has a normal mood and affect. Her behavior is normal. Judgment and thought content normal.       Assessment & Plan:

## 2012-07-21 ENCOUNTER — Encounter: Payer: Self-pay | Admitting: Internal Medicine

## 2012-08-02 DIAGNOSIS — B351 Tinea unguium: Secondary | ICD-10-CM

## 2012-08-02 HISTORY — DX: Tinea unguium: B35.1

## 2012-08-14 ENCOUNTER — Other Ambulatory Visit: Payer: Self-pay | Admitting: *Deleted

## 2012-08-14 MED ORDER — HYDROCHLOROTHIAZIDE 25 MG PO TABS: ORAL_TABLET | ORAL | Status: DC

## 2012-08-17 ENCOUNTER — Encounter: Payer: Self-pay | Admitting: Internal Medicine

## 2012-08-25 ENCOUNTER — Encounter: Payer: Self-pay | Admitting: Adult Health

## 2012-08-25 ENCOUNTER — Ambulatory Visit (INDEPENDENT_AMBULATORY_CARE_PROVIDER_SITE_OTHER): Payer: BC Managed Care – PPO | Admitting: Adult Health

## 2012-08-25 VITALS — BP 100/60 | HR 61 | Resp 12 | Wt 151.5 lb

## 2012-08-25 DIAGNOSIS — I10 Essential (primary) hypertension: Secondary | ICD-10-CM

## 2012-08-25 DIAGNOSIS — G43909 Migraine, unspecified, not intractable, without status migrainosus: Secondary | ICD-10-CM

## 2012-08-25 DIAGNOSIS — L708 Other acne: Secondary | ICD-10-CM

## 2012-08-25 DIAGNOSIS — L709 Acne, unspecified: Secondary | ICD-10-CM

## 2012-08-25 MED ORDER — METOPROLOL TARTRATE 25 MG PO TABS: 25.0000 mg | ORAL_TABLET | Freq: Two times a day (BID) | ORAL | Status: DC

## 2012-08-25 NOTE — Assessment & Plan Note (Signed)
Currently taking Relpax with good relief for migraines. She is requesting a less expensive alternative. Discussed trying Imitrex and she will inquire of the cost at [facility].

## 2012-08-25 NOTE — Patient Instructions (Signed)
  Per your request I have changed your metoprolol XL to the regular metoprolol which is on the $4 [facility] list.  Inquire what the cost of Imitrex is on your insurance plan. I will be glad to switch to one that will be more economical for you.  I did not see any equivalent to minocycline on the [facility] list but inquire with them as well.

## 2012-08-25 NOTE — Assessment & Plan Note (Signed)
Patient is currently on minocycline to control her acne. This appears to be working well; however, she is requesting an alternative that is on the [facility] $4 list. Unfortunately, I do not see one on their list. Patient will inquire with [facility] as they change and update this list often.

## 2012-08-25 NOTE — Progress Notes (Signed)
  Subjective:    Patient ID: Heather [name], female    DOB: [birth year], 37 y.o.   MRN: [id number]  HPI  Patient is a pleasant 37 year old female with history of hypertension, hyperlipidemia, migraines, acne who presents to clinic requesting change in medications that are less expensive. She is requesting equivalent medications that are found on the [facility] $4 list. Patient is currently on metoprolol XL, Relpax and minocycline for acne. She is doing well overall.   Current Outpatient Prescriptions on File Prior to Visit  Medication Sig Dispense Refill  . Calcium Carbonate (CALTRATE 600 PO) Take by mouth daily.      . minocycline (MINOCIN,DYNACIN) 100 MG capsule TAKE 1 CAPSULE (100 MG TOTAL) BY MOUTH DAILY.  90 capsule  1  . RELPAX 40 MG tablet TAKE 1 TABLET BY MOUTH AS NEEDED FOR MIGRAINE.MAY REAPEAT IN 2 HOURS.DO NOT EXCEED 2 TABS IN 24 HRS  10 tablet  0  . terbinafine (LAMISIL) 250 MG tablet Take 250 mg by mouth daily. X 4 months      . atorvastatin (LIPITOR) 20 MG tablet TAKE 1 TABLET BY MOUTH AT BEDTIME  30 tablet  3  . hydrochlorothiazide (HYDRODIURIL) 25 MG tablet TAKE 1 TABLET (25 MG TOTAL) BY MOUTH DAILY.  30 tablet  3  . HYDROcodone-acetaminophen (VICODIN) 5-500 MG per tablet Take 1 tablet by mouth every 8 (eight) hours as needed for pain.  60 tablet  1   No current facility-administered medications on file prior to visit.    Review of Systems  Constitutional: Negative.   HENT: Negative.   Respiratory: Negative.   Cardiovascular: Negative.   Gastrointestinal: Negative.   Skin:       Acne on face and neck  Neurological: Negative.   Psychiatric/Behavioral: Negative.    BP 100/60  Pulse 61  Resp 12  Wt 151 lb 8 oz (68.72 kg)  BMI 25.99 kg/m2  SpO2 98%     Objective:   Physical Exam  Constitutional: She is oriented to person, place, and time. She appears well-developed and well-nourished. No distress.  HENT:  Head: Normocephalic and atraumatic.  Cardiovascular:  Normal rate and regular rhythm.   Pulmonary/Chest: Effort normal. No respiratory distress.  Musculoskeletal: Normal range of motion. She exhibits no edema.  Neurological: She is alert and oriented to person, place, and time.  Skin: Skin is warm and dry.  Acne appears well controlled with minocycline  Psychiatric: She has a normal mood and affect. Her behavior is normal. Judgment and thought content normal.       Assessment & Plan:

## 2012-08-25 NOTE — Assessment & Plan Note (Signed)
Blood pressure is well controlled. The patient is exercising regularly by walking. She is requesting changing to less-expensive metoprolol. Will start metoprolol tartrate 25 mg twice a day. Followup in 2 weeks.

## 2012-08-26 ENCOUNTER — Other Ambulatory Visit: Payer: Self-pay | Admitting: Adult Health

## 2012-08-26 MED ORDER — RIZATRIPTAN BENZOATE 5 MG PO TABS: 5.0000 mg | ORAL_TABLET | ORAL | Status: DC | PRN

## 2012-08-28 ENCOUNTER — Other Ambulatory Visit: Payer: Self-pay | Admitting: Internal Medicine

## 2012-10-23 ENCOUNTER — Encounter: Payer: Self-pay | Admitting: Adult Health

## 2012-11-08 ENCOUNTER — Encounter: Payer: Self-pay | Admitting: *Deleted

## 2012-11-08 DIAGNOSIS — B351 Tinea unguium: Secondary | ICD-10-CM | POA: Insufficient documentation

## 2012-11-19 ENCOUNTER — Ambulatory Visit: Payer: Self-pay | Admitting: Podiatry

## 2012-12-02 ENCOUNTER — Encounter: Payer: Self-pay | Admitting: Adult Health

## 2012-12-15 ENCOUNTER — Encounter: Payer: Self-pay | Admitting: Internal Medicine

## 2012-12-15 ENCOUNTER — Ambulatory Visit (INDEPENDENT_AMBULATORY_CARE_PROVIDER_SITE_OTHER): Payer: BC Managed Care – PPO | Admitting: Internal Medicine

## 2012-12-15 VITALS — BP 110/80 | HR 60 | Temp 98.2°F | Wt 147.0 lb

## 2012-12-15 DIAGNOSIS — F4323 Adjustment disorder with mixed anxiety and depressed mood: Secondary | ICD-10-CM

## 2012-12-15 DIAGNOSIS — E785 Hyperlipidemia, unspecified: Secondary | ICD-10-CM

## 2012-12-15 LAB — CBC WITH DIFFERENTIAL/PLATELET
Basophils Absolute: 0 10*3/uL (ref 0.0–0.1)
Basophils Relative: 0.5 % (ref 0.0–3.0)
Eosinophils Absolute: 0.1 10*3/uL (ref 0.0–0.7)
Eosinophils Relative: 1.4 % (ref 0.0–5.0)
HCT: 42.5 % (ref 36.0–46.0)
Hemoglobin: 14.5 g/dL (ref 12.0–15.0)
Lymphocytes Relative: 29.4 % (ref 12.0–46.0)
Lymphs Abs: 2.2 10*3/uL (ref 0.7–4.0)
MCHC: 34.2 g/dL (ref 30.0–36.0)
MCV: 88.1 fl (ref 78.0–100.0)
Monocytes Absolute: 0.5 10*3/uL (ref 0.1–1.0)
Monocytes Relative: 6.6 % (ref 3.0–12.0)
Neutro Abs: 4.5 10*3/uL (ref 1.4–7.7)
Neutrophils Relative %: 62.1 % (ref 43.0–77.0)
Platelets: 275 10*3/uL (ref 150.0–400.0)
RBC: 4.82 Mil/uL (ref 3.87–5.11)
RDW: 14.4 % (ref 11.5–14.6)
WBC: 7.3 10*3/uL (ref 4.5–10.5)

## 2012-12-15 LAB — COMPREHENSIVE METABOLIC PANEL
ALT: 18 U/L (ref 0–35)
AST: 19 U/L (ref 0–37)
Albumin: 4.5 g/dL (ref 3.5–5.2)
Alkaline Phosphatase: 50 U/L (ref 39–117)
BUN: 15 mg/dL (ref 6–23)
CO2: 27 mEq/L (ref 19–32)
Calcium: 9.9 mg/dL (ref 8.4–10.5)
Chloride: 99 mEq/L (ref 96–112)
Creatinine, Ser: 0.7 mg/dL (ref 0.4–1.2)
GFR: 97.55 mL/min (ref >60.00)
Glucose, Bld: 85 mg/dL (ref 70–99)
Potassium: 4.2 mEq/L (ref 3.5–5.1)
Sodium: 138 mEq/L (ref 135–145)
Total Bilirubin: 1.6 mg/dL — ABNORMAL HIGH (ref 0.3–1.2)
Total Protein: 7.4 g/dL (ref 6.0–8.3)

## 2012-12-15 LAB — TSH: TSH: 1.64 u[IU]/mL (ref 0.35–5.50)

## 2012-12-15 LAB — VITAMIN B12: Vitamin B-12: 395 pg/mL (ref 211–911)

## 2012-12-15 LAB — FOLLICLE STIMULATING HORMONE: FSH: 3.8 m[IU]/mL

## 2012-12-15 LAB — LUTEINIZING HORMONE: LH: 4.87 m[IU]/mL

## 2012-12-15 MED ORDER — BUPROPION HCL ER (XL) 150 MG PO TB24: 150.0000 mg | ORAL_TABLET | Freq: Every day | ORAL | Status: DC

## 2012-12-15 NOTE — Patient Instructions (Signed)
Email with update next week.

## 2012-12-15 NOTE — Assessment & Plan Note (Signed)
Symptoms consistent with mild anxiety/depression likely related to perimenopause. Will check CBC, CMP, TSH, B12, FSH, LH with labs today. Will start Wellbutrin 150mg  daily. Follow up 4 weeks and prn.

## 2012-12-15 NOTE — Progress Notes (Signed)
Subjective:    Patient ID: Heather Heather [name], female    DOB: [birth year], 37 y.o.   MRN: [id number]  HPI  37 year old female presents for acute visit complaining of increased irritability over the last several months. She reports that things are generally going well at home and with her family however she is quick to get angry with frequent angry outbursts and irritability. She denies any significant anxiety or depression. She has not been taking anything for this. She questions whether this may be part of menopause. She denies any recent hot flashes, temperature intolerance, vaginal dryness or dyspareunia.  Outpatient Encounter Prescriptions as of 12/15/2012  Medication Sig Dispense Refill  . Calcium Carbonate (CALTRATE 600 PO) Take by mouth daily.      . hydrochlorothiazide (HYDRODIURIL) 25 MG tablet TAKE 1 TABLET (25 MG TOTAL) BY MOUTH DAILY.  30 tablet  3  . HYDROcodone-acetaminophen (VICODIN) 5-500 MG per tablet Take 1 tablet by mouth every 8 (eight) hours as needed for pain.  60 tablet  1  . metoprolol tartrate (LOPRESSOR) 25 MG tablet Take 1 tablet (25 mg total) by mouth 2 (two) times daily.  60 tablet  3   No facility-administered encounter medications on file as of 12/15/2012.   BP 110/80  Pulse 60  Temp(Src) 98.2 F (36.8 C) (Oral)  Wt 147 lb (66.679 kg)  BMI 25.22 kg/m2  SpO2 99%  Review of Systems  Constitutional: Negative for fever, chills, appetite change and fatigue.  HENT: Negative for congestion.   Eyes: Negative for visual disturbance.  Respiratory: Negative for cough and shortness of breath.   Cardiovascular: Negative for chest pain and palpitations.  Gastrointestinal: Negative for abdominal pain.  Genitourinary: Negative for dysuria and flank pain.  Musculoskeletal: Negative for arthralgias, gait problem, myalgias and neck pain.  Skin: Negative for color change and rash.  Neurological: Positive for headaches. Negative for dizziness.  Hematological: Negative for  adenopathy. Does not bruise/bleed easily.  Psychiatric/Behavioral: Positive for behavioral problems and dysphoric mood. Negative for suicidal ideas and sleep disturbance. The patient is nervous/anxious.        Objective:   Physical Exam  Constitutional: She is oriented to person, place, and time. She appears well-developed and well-nourished. No distress.  HENT:  Head: Normocephalic and atraumatic.  Right Ear: External ear normal.  Left Ear: External ear normal.  Heather [name]: Heather [name] normal.  Mouth/Throat: Oropharynx is clear and moist. No oropharyngeal exudate.  Eyes: Conjunctivae are normal. Pupils are equal, round, and reactive to light. Right eye exhibits no discharge. Left eye exhibits no discharge. No scleral icterus.  Neck: Normal range of motion. Neck supple. No tracheal deviation present. No thyromegaly present.  Cardiovascular: Normal rate, regular rhythm, normal heart sounds and intact distal pulses.  Exam reveals no gallop and no friction rub.   No murmur heard. Pulmonary/Chest: Effort normal and breath sounds normal. No accessory muscle usage. Not tachypneic. No respiratory distress. She has no decreased breath sounds. She has no wheezes. She has no rhonchi. She has no rales. She exhibits no tenderness.  Musculoskeletal: Normal range of motion. She exhibits no edema and no tenderness.  Lymphadenopathy:    She has no cervical adenopathy.  Neurological: She is alert and oriented to person, place, and time. No cranial nerve deficit. She exhibits normal muscle tone. Coordination normal.  Skin: Skin is warm and dry. No rash noted. She is not diaphoretic. No erythema. No pallor.  Psychiatric: She has a normal mood and affect. Her behavior is normal. Judgment  and thought content normal.          Assessment & Plan:

## 2012-12-25 ENCOUNTER — Other Ambulatory Visit: Payer: Self-pay

## 2013-01-03 ENCOUNTER — Other Ambulatory Visit: Payer: Self-pay | Admitting: Adult Health

## 2013-01-06 ENCOUNTER — Other Ambulatory Visit: Payer: Self-pay | Admitting: *Deleted

## 2013-01-06 MED ORDER — HYDROCHLOROTHIAZIDE 25 MG PO TABS: ORAL_TABLET | ORAL | Status: DC

## 2013-01-10 ENCOUNTER — Encounter: Payer: Self-pay | Admitting: Internal Medicine

## 2013-02-07 ENCOUNTER — Other Ambulatory Visit: Payer: Self-pay | Admitting: Adult Health

## 2013-03-07 ENCOUNTER — Other Ambulatory Visit: Payer: Self-pay | Admitting: Internal Medicine

## 2013-04-20 ENCOUNTER — Ambulatory Visit: Payer: BC Managed Care – PPO | Admitting: Internal Medicine

## 2013-04-20 ENCOUNTER — Ambulatory Visit (INDEPENDENT_AMBULATORY_CARE_PROVIDER_SITE_OTHER): Payer: BC Managed Care – PPO | Admitting: Adult Health

## 2013-04-20 ENCOUNTER — Encounter: Payer: Self-pay | Admitting: Adult Health

## 2013-04-20 VITALS — BP 112/64 | HR 89 | Temp 98.8°F | Resp 14 | Wt 149.0 lb

## 2013-04-20 DIAGNOSIS — J019 Acute sinusitis, unspecified: Secondary | ICD-10-CM

## 2013-04-20 DIAGNOSIS — R6889 Other general symptoms and signs: Secondary | ICD-10-CM | POA: Insufficient documentation

## 2013-04-20 LAB — POCT INFLUENZA A/B
Influenza A, POC: NEGATIVE
Influenza B, POC: NEGATIVE

## 2013-04-20 MED ORDER — AMOXICILLIN-POT CLAVULANATE 875-125 MG PO TABS
1.0000 | ORAL_TABLET | Freq: Two times a day (BID) | ORAL | Status: DC
Start: 2013-04-20 — End: 2013-05-01

## 2013-04-20 NOTE — Progress Notes (Signed)
   Subjective:    Patient ID: Heather [name], female    DOB: 10/14/1975, 38 y.o.   MRN: [id number]  HPI Pt is a 38 y/o female who presents to clinic with c/o sore throat, HA, facial pain, chills and body aches. She also has sinus drainage and rhinorrhea. She reports she was sick approximately 1 month ago for approximately 16 days. She felt she was improving but then on Saturday she came down with the above symptoms. 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 She has been taking Nyquil, tylenol and "whatever my husband gives me".    Current Outpatient Prescriptions on File Prior to Visit  Medication Sig Dispense Refill  . buPROPion (WELLBUTRIN XL) 150 MG 24 hr tablet TAKE ONE TABLET BY MOUTH ONCE DAILY  30 tablet  2  . Calcium Carbonate (CALTRATE 600 PO) Take by mouth daily.      . hydrochlorothiazide (HYDRODIURIL) 25 MG tablet TAKE 1 TABLET (25 MG TOTAL) BY MOUTH DAILY.  30 tablet  3  . HYDROcodone-acetaminophen (VICODIN) 5-500 MG per tablet Take 1 tablet by mouth every 8 (eight) hours as needed for pain.  60 tablet  1  . metoprolol tartrate (LOPRESSOR) 25 MG tablet TAKE ONE TABLET BY MOUTH TWICE DAILY  60 tablet  5  . rizatriptan (MAXALT) 5 MG tablet TAKE 1 TABLET BY MOUTH AS NEEDED FOR MIGRAINE. MAY REPEAT IN 2 HOURS IF NEEDED  10 tablet  0   No current facility-administered medications on file prior to visit.      Review of Systems  Constitutional: Positive for chills. Negative for fever.  HENT: Positive for congestion, postnasal drip, rhinorrhea and sinus pressure. Negative for sore throat.   Respiratory: Positive for cough. Negative for wheezing.   Musculoskeletal:       Body aches  Psychiatric/Behavioral: Negative.        Objective:   Physical Exam  Constitutional: She is oriented to person, place, and time. She appears well-developed and well-nourished.  Acutely ill  HENT:  Head: Normocephalic and atraumatic.  Right Ear: External ear  normal.  Left Ear: External ear normal.  Pharyngeal erythema  Eyes: Conjunctivae and EOM are normal.  Neck: Normal range of motion. Neck supple.  Cardiovascular: Normal rate, regular rhythm, normal heart sounds and intact distal pulses.  Exam reveals no gallop.   No murmur heard. Pulmonary/Chest: Effort normal and breath sounds normal. No respiratory distress. She has no wheezes. She has no rales.  Musculoskeletal: Normal range of motion.  Lymphadenopathy:    She has cervical adenopathy.  Neurological: She is alert and oriented to person, place, and time.  Psychiatric: She has a normal mood and affect. Her behavior is normal. Judgment and thought content normal.       Assessment & Plan:   1. Flu-like symptoms Negative for flu - POCT Influenza A/B  2. Acute sinusitis with symptoms greater than 10 days Suspect symptoms from sinusitis unresolved. Start Augmentin x 7 days. RTC if no improvement in symptoms within 3-4 days.

## 2013-04-20 NOTE — Progress Notes (Signed)
Pre visit review using our clinic review tool, if applicable. No additional management support is needed unless otherwise documented below in the visit note. 

## 2013-04-20 NOTE — Patient Instructions (Addendum)
  Start Augmentin twice a day for 7 days.  Continue with medications you have been taking to help with symptoms.  Drink plenty of fluids to maintain hydration.  Call if no improvement within 3-4 days.

## 2013-04-29 ENCOUNTER — Encounter: Payer: Self-pay | Admitting: Adult Health

## 2013-05-01 ENCOUNTER — Encounter: Payer: Self-pay | Admitting: Internal Medicine

## 2013-05-01 ENCOUNTER — Ambulatory Visit (INDEPENDENT_AMBULATORY_CARE_PROVIDER_SITE_OTHER): Payer: BC Managed Care – PPO | Admitting: Family Medicine

## 2013-05-01 ENCOUNTER — Encounter: Payer: Self-pay | Admitting: Family Medicine

## 2013-05-01 VITALS — BP 106/76 | HR 80 | Temp 98.2°F | Wt 149.2 lb

## 2013-05-01 DIAGNOSIS — J019 Acute sinusitis, unspecified: Secondary | ICD-10-CM

## 2013-05-01 MED ORDER — BENZONATATE 200 MG PO CAPS: 200.0000 mg | ORAL_CAPSULE | Freq: Three times a day (TID) | ORAL | Status: DC | PRN

## 2013-05-01 MED ORDER — AMOXICILLIN-POT CLAVULANATE 875-125 MG PO TABS: 1.0000 | ORAL_TABLET | Freq: Two times a day (BID) | ORAL | Status: DC

## 2013-05-01 NOTE — Patient Instructions (Signed)
Start the antibiotics today.  Tessalon for cough.   Heat and ice for your back along with ibuprofen.  Drink plenty of fluids and gargle with warm salt water for your throat.  This should gradually improve.  Take care.  Let us know if you have other concerns.

## 2013-05-01 NOTE — Progress Notes (Signed)
Pre visit review using our clinic review tool, if applicable. No additional management support is needed unless otherwise documented below in the visit note.  Sick for a month before the last OV.  Started on augmentin at that point. Was making some improvement until abx were done.  7 days of augmentin.  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  Nonsmoker. No ear pain.  Some sputum nearly coughed up, but "it won't come all the way out."  Post nasal gtt noted.    Meds, vitals, and allergies reviewed.   ROS: See HPI.  Otherwise, noncontributory.  GEN: nad, alert and oriented HEENT: mucous membranes moist, tm w/o erythema, nasal exam w/o erythema, clear discharge noted,  OP with cobblestoning, R frontal and L maxillary sinuses ttp NECK: supple w/o LA CV: rrr.   PULM: ctab, no inc wob EXT: no edema SKIN: no acute rash

## 2013-05-02 NOTE — Assessment & Plan Note (Signed)
She has some improvement with augmentin, possibly not long enough tx.  Restart, take for 10 days.  She agrees.  Nontoxic.  Fu prn. Routed to PCP as FYI.

## 2013-05-04 ENCOUNTER — Encounter: Payer: Self-pay | Admitting: Internal Medicine

## 2013-05-05 ENCOUNTER — Telehealth: Payer: Self-pay | Admitting: Internal Medicine

## 2013-05-05 NOTE — Telephone Encounter (Signed)
See below phone note  Appointment Request From: Charlett NoseAmanda G Haggar With Provider: Wynona DoveWALKER,JENNIFER AZBELL, MD [-Primary Care Physician-]  Preferred Date Range: From 05/05/2013 To 05/05/2013  Preferred Times: Tuesday Morning, Tuesday Afternoon  Reason for visit:  Office Visit Comments: I feel no relief. I've been in twice already. This cough is making me tired, head hurts from it..back has been thrown out too. 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 please call my cell [phone]. Thanks!

## 2013-05-06 ENCOUNTER — Encounter: Payer: Self-pay | Admitting: Family Medicine

## 2013-05-06 ENCOUNTER — Ambulatory Visit (INDEPENDENT_AMBULATORY_CARE_PROVIDER_SITE_OTHER): Payer: BC Managed Care – PPO | Admitting: Family Medicine

## 2013-05-06 ENCOUNTER — Telehealth: Payer: Self-pay

## 2013-05-06 VITALS — BP 110/74 | HR 75 | Temp 98.1°F | Ht 63.0 in | Wt 151.5 lb

## 2013-05-06 DIAGNOSIS — R05 Cough: Secondary | ICD-10-CM

## 2013-05-06 DIAGNOSIS — J189 Pneumonia, unspecified organism: Secondary | ICD-10-CM

## 2013-05-06 DIAGNOSIS — R059 Cough, unspecified: Secondary | ICD-10-CM

## 2013-05-06 MED ORDER — AZITHROMYCIN 250 MG PO TABS: ORAL_TABLET | ORAL | Status: DC

## 2013-05-06 NOTE — Telephone Encounter (Signed)
Pt left v/m; pt was seen earlier today and did not ask if pt can return to work on 05/07/13.Please advise.

## 2013-05-06 NOTE — Progress Notes (Signed)
Pre visit review using our clinic review tool, if applicable. No additional management support is needed unless otherwise documented below in the visit note. 

## 2013-05-06 NOTE — Telephone Encounter (Signed)
Patient was sent a Mychart message to call the office to schedule. Requested an appointment for the same day using Mychart.

## 2013-05-06 NOTE — Progress Notes (Signed)
Date:  05/06/2013   Name:  Heather [name]   DOB:  04/02/1975   MRN:  [id number] Gender: female Age: 38 y.o.  Primary Physician:  Wynona DoveWALKER,JENNIFER AZBELL, MD   Chief Complaint: Cough and Otalgia   Subjective:   History of Present Illness:  Heather [name] is a 38 y.o. very pleasant female patient who presents with the following:  Patient who initially started to become ill in March 25, 2013. At that point she was diagnosed with a flulike illness, but also had some sinus-type symptoms and was given some Augmentin. Ultimately she saw my partner Dr. Para Marchuncan in followup 5 days prior for now, and she was placed on a second course of Augmentin without any relief of symptoms. She is coughing virtually all the time, and she is up at night due to coughing. She has been taking some Tessalon Perles without much relief of her symptoms. She is currently afebrile. 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  S/p Augmentin, cannot get rid of cough. And it is so bad that back gave up.   03/25/2013 - onset.  2 rounds of Augmentin.   Past Medical History, Surgical History, Social History, Family History, Problem List, Medications, and Allergies have been reviewed and updated if relevant.  Review of Systems: ROS: GEN: Acute illness details above GI: Tolerating PO intake GU: maintaining adequate hydration and urination Pulm: No SOB Interactive and getting along well at home.  Otherwise, ROS is as per the HPI.   Objective:   Physical Examination: BP 110/74  Pulse 75  Temp(Src) 98.1 F (36.7 C) (Oral)  Ht 5\' 3"  (1.6 m)  Wt 151 lb 8 oz (68.72 kg)  BMI 26.84 kg/m2  SpO2 98%  LMP 04/25/2013   GEN: A and O x 3. WDWN. NAD.    ENT: Nose clear, ext NML.  No LAD.  No JVD.  TM's clear. Oropharynx clear.  PULM: Normal WOB, no distress. No crackles, wheezes, rhonchi. CV: RRR, no M/G/R, No rubs, No JVD.   EXT: warm and well-perfused, No c/c/e. PSYCH: Pleasant and  conversant.    Laboratory and Imaging Data: Recent Results (from the past 2160 hour(s))  POCT INFLUENZA A/B     Status: None   Collection Time    04/20/13  3:17 PM      Result Value Ref Range   Influenza A, POC Negative     Influenza B, POC Negative       Assessment & Plan:   Atypical pneumonia - Plan: B pertussis IgG/IgM Ab, CANCELED: Bordetella Pertussis PCR  Coughing - Plan: B pertussis IgG/IgM Ab, CANCELED: Bordetella Pertussis PCR  At 6 weeks of pronounced cough. More likely atypical pneumonia versus pertussis. We will treat with Zithromax. Also check pertussis antibodies, but I think this is significantly less likely. 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  Orders Placed This Encounter  Procedures  . B pertussis IgG/IgM Ab   Patient's Medications  New Prescriptions   AZITHROMYCIN (ZITHROMAX Z-PAK) 250 MG TABLET    Take 2 tablets (500 mg) on  Day 1,  followed by 1 tablet (250 mg) once daily on Days 2 through 5.  Previous Medications   BENZONATATE (TESSALON) 200 MG CAPSULE    Take 1 capsule (200 mg total) by mouth 3 (three) times daily as needed for cough.   BUPROPION (WELLBUTRIN XL) 150 MG 24 HR TABLET    TAKE ONE TABLET BY MOUTH ONCE DAILY  CALCIUM CARBONATE (CALTRATE 600 PO)    Take by mouth daily.   HYDROCHLOROTHIAZIDE (HYDRODIURIL) 25 MG TABLET    TAKE 1 TABLET (25 MG TOTAL) BY MOUTH DAILY.   HYDROCODONE-ACETAMINOPHEN (VICODIN) 5-500 MG PER TABLET    Take 1 tablet by mouth every 8 (eight) hours as needed for pain.   METOPROLOL TARTRATE (LOPRESSOR) 25 MG TABLET    TAKE ONE TABLET BY MOUTH TWICE DAILY   RIZATRIPTAN (MAXALT) 5 MG TABLET    TAKE 1 TABLET BY MOUTH AS NEEDED FOR MIGRAINE. MAY REPEAT IN 2 HOURS IF NEEDED  Modified Medications   No medications on file  Discontinued Medications   AMOXICILLIN-CLAVULANATE (AUGMENTIN) 875-125 MG PER TABLET    Take 1 tablet by mouth 2 (two) times daily.   There are no Patient  Instructions on file for this visit.  Signed,  Elpidio Galea. Jaleena Viviani, MD, CAQ Sports Medicine  [employer] [facility] [address] [place] [address] Phone: [phone] Fax: [phone]

## 2013-05-06 NOTE — Telephone Encounter (Signed)
Yes, that should be fine. Hope she feels better.

## 2013-05-06 NOTE — Telephone Encounter (Signed)
[name] notified okay to return to work on 05/07/2013 per Dr. Patsy Lageropland.

## 2013-05-08 ENCOUNTER — Other Ambulatory Visit: Payer: Self-pay | Admitting: Internal Medicine

## 2013-05-08 LAB — B PERTUSSIS IGG/IGM AB
B pertussis IgG Ab: 1.58 index — ABNORMAL HIGH (ref 0.00–0.94)
B pertussis IgM Ab, Quant: <1 index (ref 0.0–0.9)

## 2013-05-08 NOTE — Telephone Encounter (Signed)
Seen at [facility] [place] [place] [facility] 05/06/13

## 2013-05-26 ENCOUNTER — Other Ambulatory Visit: Payer: Self-pay | Admitting: Internal Medicine

## 2013-06-06 ENCOUNTER — Other Ambulatory Visit: Payer: Self-pay | Admitting: Internal Medicine

## 2013-07-07 ENCOUNTER — Other Ambulatory Visit: Payer: Self-pay | Admitting: Internal Medicine

## 2013-08-05 ENCOUNTER — Ambulatory Visit (INDEPENDENT_AMBULATORY_CARE_PROVIDER_SITE_OTHER): Payer: BC Managed Care – PPO | Admitting: Internal Medicine

## 2013-08-05 ENCOUNTER — Encounter: Payer: Self-pay | Admitting: Internal Medicine

## 2013-08-05 VITALS — BP 98/70 | HR 63 | Temp 98.4°F | Ht 63.7 in | Wt 147.5 lb

## 2013-08-05 DIAGNOSIS — Z0000 Encounter for general adult medical examination without abnormal findings: Secondary | ICD-10-CM

## 2013-08-05 DIAGNOSIS — G43909 Migraine, unspecified, not intractable, without status migrainosus: Secondary | ICD-10-CM

## 2013-08-05 DIAGNOSIS — I10 Essential (primary) hypertension: Secondary | ICD-10-CM

## 2013-08-05 DIAGNOSIS — Z23 Encounter for immunization: Secondary | ICD-10-CM

## 2013-08-05 LAB — COMPREHENSIVE METABOLIC PANEL
ALT: 18 U/L (ref 0–35)
AST: 20 U/L (ref 0–37)
Albumin: 4.6 g/dL (ref 3.5–5.2)
Alkaline Phosphatase: 50 U/L (ref 39–117)
BUN: 12 mg/dL (ref 6–23)
CO2: 29 mEq/L (ref 19–32)
Calcium: 9.8 mg/dL (ref 8.4–10.5)
Chloride: 100 mEq/L (ref 96–112)
Creatinine, Ser: 0.8 mg/dL (ref 0.4–1.2)
GFR: 80.08 mL/min (ref >60.00)
Glucose, Bld: 90 mg/dL (ref 70–99)
Potassium: 3.9 mEq/L (ref 3.5–5.1)
Sodium: 137 mEq/L (ref 135–145)
Total Bilirubin: 1.4 mg/dL — ABNORMAL HIGH (ref 0.2–1.2)
Total Protein: 7.1 g/dL (ref 6.0–8.3)

## 2013-08-05 LAB — CBC WITH DIFFERENTIAL/PLATELET
Basophils Absolute: 0 10*3/uL (ref 0.0–0.1)
Basophils Relative: 0.7 % (ref 0.0–3.0)
Eosinophils Absolute: 0.1 10*3/uL (ref 0.0–0.7)
Eosinophils Relative: 1.3 % (ref 0.0–5.0)
HCT: 42.3 % (ref 36.0–46.0)
Hemoglobin: 14.4 g/dL (ref 12.0–15.0)
Lymphocytes Relative: 33.7 % (ref 12.0–46.0)
Lymphs Abs: 2.2 10*3/uL (ref 0.7–4.0)
MCHC: 34 g/dL (ref 30.0–36.0)
MCV: 90.8 fl (ref 78.0–100.0)
Monocytes Absolute: 0.5 10*3/uL (ref 0.1–1.0)
Monocytes Relative: 7.4 % (ref 3.0–12.0)
Neutro Abs: 3.6 10*3/uL (ref 1.4–7.7)
Neutrophils Relative %: 56.9 % (ref 43.0–77.0)
Platelets: 302 10*3/uL (ref 150.0–400.0)
RBC: 4.66 Mil/uL (ref 3.87–5.11)
RDW: 12.9 % (ref 11.5–15.5)
WBC: 6.4 10*3/uL (ref 4.0–10.5)

## 2013-08-05 LAB — LIPID PANEL
Cholesterol: 229 mg/dL — ABNORMAL HIGH (ref 0–200)
HDL: 57.5 mg/dL (ref >39.00)
LDL Cholesterol: 159 mg/dL — ABNORMAL HIGH (ref 0–99)
NonHDL: 171.5
Total CHOL/HDL Ratio: 4
Triglycerides: 64 mg/dL (ref 0.0–149.0)
VLDL: 12.8 mg/dL (ref 0.0–40.0)

## 2013-08-05 LAB — VITAMIN D 25 HYDROXY (VIT D DEFICIENCY, FRACTURES): VITD: 26.63 ng/mL

## 2013-08-05 MED ORDER — RIZATRIPTAN BENZOATE 10 MG PO TABS: 10.0000 mg | ORAL_TABLET | ORAL | Status: DC | PRN

## 2013-08-05 MED ORDER — HYDROCHLOROTHIAZIDE 25 MG PO TABS: 25.0000 mg | ORAL_TABLET | Freq: Every day | ORAL | Status: DC

## 2013-08-05 MED ORDER — BUPROPION HCL ER (XL) 150 MG PO TB24: 150.0000 mg | ORAL_TABLET | Freq: Every day | ORAL | Status: DC

## 2013-08-05 MED ORDER — METOPROLOL TARTRATE 25 MG PO TABS: 25.0000 mg | ORAL_TABLET | Freq: Two times a day (BID) | ORAL | Status: DC

## 2013-08-05 NOTE — Assessment & Plan Note (Signed)
BP Readings from Last 3 Encounters:  08/05/13 98/70  05/06/13 110/74  05/01/13 106/76   BP well controlled on Metoprolol and HCTZ. Will continue. Check renal function with labs today.

## 2013-08-05 NOTE — Progress Notes (Signed)
Pre visit review using our clinic review tool, if applicable. No additional management support is needed unless otherwise documented below in the visit note. 

## 2013-08-05 NOTE — Addendum Note (Signed)
Addended by: Marchia MeiersEASTWOOD, Izell Labat M on: 08/05/2013 10:26 AM   Modules accepted: Orders

## 2013-08-05 NOTE — Patient Instructions (Addendum)
We will check labs today.  Follow up in 6 months or as needed.

## 2013-08-05 NOTE — Assessment & Plan Note (Signed)
Symptoms well controlled with prn Maxalt. Will continue.

## 2013-08-05 NOTE — Progress Notes (Signed)
Subjective:    Patient ID: Heather [name], female    DOB: 12/02/1975, 38 y.o.   MRN: [id number]  HPI 38YO female presents for annual exam. Generally, feeling well. No concerns today. Migraines have been well controlled with use of Maxalt, however has to use 10mg  dosing. Typically, HA occur just prior to menses.  Trying to stay active and follow a healthy diet.   Review of Systems  Constitutional: Negative for fever, chills, appetite change, fatigue and unexpected weight change.  HENT: Negative for congestion, ear pain, sinus pressure, sore throat, trouble swallowing and voice change.   Eyes: Negative for visual disturbance.  Respiratory: Negative for cough, shortness of breath, wheezing and stridor.   Cardiovascular: Negative for chest pain, palpitations and leg swelling.  Gastrointestinal: Negative for nausea, vomiting, abdominal pain, diarrhea, constipation, blood in stool, abdominal distention and anal bleeding.  Genitourinary: Negative for dysuria and flank pain.  Musculoskeletal: Negative for arthralgias, gait problem, myalgias and neck pain.  Skin: Negative for color change and rash.  Neurological: Positive for headaches. Negative for dizziness.  Hematological: Negative for adenopathy. Does not bruise/bleed easily.  Psychiatric/Behavioral: Negative for suicidal ideas, sleep disturbance and dysphoric mood. The patient is not nervous/anxious.        Objective:    BP 98/70  Pulse 63  Temp(Src) 98.4 F (36.9 C) (Oral)  Ht 5' 3.7" (1.618 m)  Wt 147 lb 8 oz (66.906 kg)  BMI 25.56 kg/m2  SpO2 99%  LMP 07/24/2013 Physical Exam  Constitutional: She is oriented to person, place, and time. She appears well-developed and well-nourished. No distress.  HENT:  Head: Normocephalic and atraumatic.  Right Ear: External ear normal.  Left Ear: External ear normal.  Nose: Nose normal.  Mouth/Throat: Oropharynx is clear and moist. No oropharyngeal exudate.  Eyes: Conjunctivae are  normal. Pupils are equal, round, and reactive to light. Right eye exhibits no discharge. Left eye exhibits no discharge. No scleral icterus.  Neck: Normal range of motion. Neck supple. No tracheal deviation present. No thyromegaly present.  Cardiovascular: Normal rate, regular rhythm, normal heart sounds and intact distal pulses.  Exam reveals no gallop and no friction rub.   No murmur heard. Pulmonary/Chest: Effort normal and breath sounds normal. No accessory muscle usage. Not tachypneic. No respiratory distress. She has no decreased breath sounds. She has no wheezes. She has no rales. She exhibits no tenderness. Right breast exhibits no inverted nipple, no mass, no nipple discharge, no skin change and no tenderness. Left breast exhibits no inverted nipple, no mass, no nipple discharge, no skin change and no tenderness. Breasts are symmetrical.  Abdominal: Soft. Bowel sounds are normal. She exhibits no distension and no mass. There is no tenderness. There is no rebound and no guarding.  Musculoskeletal: Normal range of motion. She exhibits no edema and no tenderness.  Lymphadenopathy:    She has no cervical adenopathy.  Neurological: She is alert and oriented to person, place, and time. No cranial nerve deficit. She exhibits normal muscle tone. Coordination normal.  Skin: Skin is warm and dry. No rash noted. She is not diaphoretic. No erythema. No pallor.  Psychiatric: She has a normal mood and affect. Her behavior is normal. Judgment and thought content normal.          Assessment & Plan:   Problem List Items Addressed This Visit     Unprioritized   Hypertension      BP Readings from Last 3 Encounters:  08/05/13 98/70  05/06/13 110/74  05/01/13 106/76   BP well controlled on Metoprolol and HCTZ. Will continue. Check renal function with labs today.    Relevant Medications      metoprolol tartrate (LOPRESSOR) tablet      hydrochlorothiazide tablet   Migraine     Symptoms well  controlled with prn Maxalt. Will continue.    Relevant Medications      rizatriptan (MAXALT) tablet      metoprolol tartrate (LOPRESSOR) tablet      hydrochlorothiazide tablet      buPROPion (WELLBUTRIN XL) 24 hr tablet   Routine general medical examination at a health care facility - Primary     General medical exam normal today including breast exam. PAP deferred as normal 2013, will request results from OB. Declines Flu vaccine. Tdap given today. Will check labs including CBC, CMP, lipids. Encouraged healthy diet and exercise.     Relevant Orders      CBC with Differential      Comprehensive metabolic panel      Lipid panel      Vit D  25 hydroxy (rtn osteoporosis monitoring)       Return in about 6 months (around 02/04/2014) for Recheck.

## 2013-08-05 NOTE — Assessment & Plan Note (Signed)
General medical exam normal today including breast exam. PAP deferred as normal 2013, will request results from OB. Declines Flu vaccine. Tdap given today. Will check labs including CBC, CMP, lipids. Encouraged healthy diet and exercise.

## 2013-08-06 ENCOUNTER — Telehealth: Payer: Self-pay | Admitting: Internal Medicine

## 2013-08-06 NOTE — Telephone Encounter (Signed)
Relevant patient education assigned to patient using Emmi. ° °

## 2013-09-16 ENCOUNTER — Ambulatory Visit (INDEPENDENT_AMBULATORY_CARE_PROVIDER_SITE_OTHER): Payer: BC Managed Care – PPO | Admitting: Adult Health

## 2013-09-16 ENCOUNTER — Encounter: Payer: Self-pay | Admitting: Adult Health

## 2013-09-16 VITALS — BP 109/74 | HR 81 | Temp 98.7°F | Resp 14 | Wt 147.8 lb

## 2013-09-16 DIAGNOSIS — M545 Low back pain, unspecified: Secondary | ICD-10-CM

## 2013-09-16 MED ORDER — CYCLOBENZAPRINE HCL 5 MG PO TABS: 5.0000 mg | ORAL_TABLET | Freq: Three times a day (TID) | ORAL | Status: DC | PRN

## 2013-09-16 MED ORDER — PREDNISONE 10 MG PO TABS: ORAL_TABLET | ORAL | Status: DC

## 2013-09-16 NOTE — Progress Notes (Signed)
Pre visit review using our clinic review tool, if applicable. No additional management support is needed unless otherwise documented below in the visit note. 

## 2013-09-16 NOTE — Progress Notes (Signed)
Patient ID: Heather [name], female   DOB: 06/03/1975, 38 y.o.   MRN: [id number]   Subjective:    Patient ID: Heather NoseAmanda G [name], female    DOB: 10/15/1975, 38 y.o.   MRN: [id number]  HPI  Hx of back injury years ago. [name] hurt her back in the beginning of July. She then decided she wanted to clean out her garage and the symptoms worsened. She is having trouble sleeping 2/2 pain. She is having pain with movements. No numbness or tingling reported.  Past Medical History  Diagnosis Date  . Hypertension   . Hyperlipidemia   . Nail fungus 06.14.2014    Current Outpatient Prescriptions on File Prior to Visit  Medication Sig Dispense Refill  . buPROPion (WELLBUTRIN XL) 150 MG 24 hr tablet Take 1 tablet (150 mg total) by mouth daily.  90 tablet  3  . Calcium Carbonate (CALTRATE 600 PO) Take by mouth daily.      . hydrochlorothiazide (HYDRODIURIL) 25 MG tablet Take 1 tablet (25 mg total) by mouth daily.  90 tablet  3  . metoprolol tartrate (LOPRESSOR) 25 MG tablet Take 1 tablet (25 mg total) by mouth 2 (two) times daily.  180 tablet  3  . rizatriptan (MAXALT) 10 MG tablet Take 1 tablet (10 mg total) by mouth as needed for migraine. May repeat in 2 hours if needed  10 tablet  6   No current facility-administered medications on file prior to visit.     Review of Systems  Constitutional: Negative for fever, chills and fatigue.  Respiratory: Negative.   Cardiovascular: Negative.   Gastrointestinal: Negative.   Genitourinary: Negative.   Musculoskeletal: Positive for back pain.  Neurological: Negative for numbness and headaches.  All other systems reviewed and are negative.      Objective:  BP 109/74  Pulse 81  Temp(Src) 98.7 F (37.1 C) (Oral)  Resp 14  Wt 147 lb 12 oz (67.019 kg)  SpO2 98%   Physical Exam  Constitutional: She is oriented to person, place, and time. No distress.  Cardiovascular: Normal rate and regular rhythm.   Pulmonary/Chest: Effort normal. No  respiratory distress.  Musculoskeletal: She exhibits tenderness. She exhibits no edema.  Extreme guarded movements. Trouble getting from sitting to standing position.  Neurological: She is alert and oriented to person, place, and time.  Skin: Skin is warm and dry.  Psychiatric: She has a normal mood and affect. Her behavior is normal. Judgment and thought content normal.       Assessment & Plan:   1. Midline low back pain without sciatica Prednisone taper. No NSAIDs while on taper. May take when complete. Flexeril Ice for 20 min 3-4 times daily RTC if no improvement within 2 weeks.

## 2013-09-16 NOTE — Patient Instructions (Signed)
   Start Prednisone taper. Take with food.  Start prednisone taper as follows:  Day #1 - take 6 tablets Day #2 - take 5 tablets Day #3 - take 4 tablets Day #4 - take 3 tablets Day #5 - take 2 tablets Day #6 - take 1 tablet   Also take flexeril for muscle spasms. This will make you sleepy so only take this at bedtime when you are working.  Apply ice alternating with heat to the affected areas for 15 min at a time. Do this approximately 3-4 times daily.  Use a firm pillow between your knees when you lie on your side or under your knees when you lie on your back.  Return to clinic if your symptoms are not improving within 2 weeks or sooner if your symptoms worsen.

## 2014-01-18 ENCOUNTER — Telehealth: Payer: Self-pay | Admitting: Family

## 2014-01-18 DIAGNOSIS — H109 Unspecified conjunctivitis: Secondary | ICD-10-CM

## 2014-01-18 MED ORDER — POLYMYXIN B-TRIMETHOPRIM 10000-0.1 UNIT/ML-% OP SOLN: 2.0000 [drp] | Freq: Four times a day (QID) | OPHTHALMIC | Status: DC

## 2014-01-18 NOTE — Progress Notes (Signed)
We are sorry that you are not feeling well.  Here is how we plan to help!  Based on what you have shared with me it looks like you have conjunctivitis.  Conjunctivitis is a common inflammatory or infectious condition of the eye that is often referred to as "pink eye".  In most cases it is contagious (viral or bacterial). However, not all conjunctivitis requires antibiotics (ex. Allergic).  We have made appropriate suggestions for you based upon your presentation.  I have prescribed Polytrim Ophthalmic drops 1-2 drops 4 times a day times 5 days  Pink eye can be highly contagious.  It is typically spread through direct contact with secretions, or contaminated objects or surfaces that one may have touched.  Strict handwashing is suggested with soap and water is urged.  If not available, use alcohol based had sanitizer.  Avoid unnecessary touching of the eye.  If you wear contact lenses, you will need to refrain from wearing them until you see no white discharge from the eye for at least 24 hours after being on medication.  You should see symptom improvement in 1-2 days after starting the medication regimen.  Call us if symptoms are not improved in 1-2 days.  Home Care:  Wash your hands often!  Do not wear your contacts until you complete your treatment plan.  Avoid sharing towels, bed linen, personal items with a person who has pink eye.  See attention for anyone in your home with similar symptoms.  Get Help Right Away If:  Your symptoms do not improve.  You develop blurred or loss of vision.  Your symptoms worsen (increased discharge, pain or redness)  Your e-visit answers were reviewed by a board certified advanced clinical practitioner to complete your personal care plan.  Depending on the condition, your plan could have included both over the counter or prescription medications.  Please review your pharmacy choice.  If there is a problem, you may call our nursing hot line at [phone]  and have the prescription routed to another pharmacy.  Your safety is important to us.  If you have drug allergies check your prescription carefully.    You can use MyChart to ask questions about today's visit, request a non-urgent call back, or ask for a work or school excuse.  You will get an e-mail in the next two days asking about your experience.  I hope that your e-visit has been valuable and will speed your recovery. Thank you for using e-visits.      

## 2014-01-20 ENCOUNTER — Telehealth: Payer: Self-pay | Admitting: Family

## 2014-01-20 DIAGNOSIS — J019 Acute sinusitis, unspecified: Secondary | ICD-10-CM

## 2014-01-20 MED ORDER — AMOXICILLIN-POT CLAVULANATE 875-125 MG PO TABS: 1.0000 | ORAL_TABLET | Freq: Two times a day (BID) | ORAL | Status: DC

## 2014-01-20 NOTE — Progress Notes (Signed)

## 2014-06-01 ENCOUNTER — Encounter: Payer: Self-pay | Admitting: Nurse Practitioner

## 2014-06-01 ENCOUNTER — Ambulatory Visit (INDEPENDENT_AMBULATORY_CARE_PROVIDER_SITE_OTHER): Payer: BC Managed Care – PPO | Admitting: Nurse Practitioner

## 2014-06-01 DIAGNOSIS — J020 Streptococcal pharyngitis: Secondary | ICD-10-CM

## 2014-06-01 LAB — POCT RAPID STREP A (OFFICE): Rapid Strep A Screen: POSITIVE — AB

## 2014-06-01 MED ORDER — AMOXICILLIN-POT CLAVULANATE 875-125 MG PO TABS: 1.0000 | ORAL_TABLET | Freq: Two times a day (BID) | ORAL | Status: DC

## 2014-06-01 NOTE — Progress Notes (Signed)
Pre visit review using our clinic review tool, if applicable. No additional management support is needed unless otherwise documented below in the visit note. 

## 2014-06-01 NOTE — Progress Notes (Signed)
   Subjective:    Patient ID: Charlett NoseAmanda G Bares, female    DOB: 12/08/1975, [age] y.o.   MRN: [id number]  HPI  Ms. Scherrie MerrittsCiavarella is a 39 yo female with a CC of sore throat x 3 days.   1) Sore throat, "razors in throat" x 3 days, Daughter and daughter's best friend had same symptoms and strep positive. They were treated.   Review of Systems  Constitutional: Negative for fever, chills, diaphoresis and fatigue.  HENT: Positive for sore throat.       Objective:   Physical Exam  Constitutional: She is oriented to person, place, and time. She appears well-developed and well-nourished. No distress.  BP 108/70 mmHg  Pulse 81  Temp(Src) 98.3 F (36.8 C) (Oral)  Resp 12  Ht 5' 3.7" (1.618 m)  Wt 147 lb 12.8 oz (67.042 kg)  BMI 25.61 kg/m2  SpO2 99%  LMP 05/12/2014 (Approximate)   HENT:  Head: Normocephalic and atraumatic.  Right Ear: External ear normal.  Left Ear: External ear normal.  Mouth/Throat: Oropharyngeal exudate present.  Oropharynx red  Eyes: Right eye exhibits no discharge. Left eye exhibits no discharge. No scleral icterus.  Neck: Normal range of motion. Neck supple.  Lymphadenopathy:    She has cervical adenopathy.  Neurological: She is alert and oriented to person, place, and time.  Skin: Skin is warm and dry. She is not diaphoretic.  Psychiatric: She has a normal mood and affect. Her behavior is normal. Judgment and thought content normal.      Assessment & Plan:

## 2014-06-01 NOTE — Patient Instructions (Signed)
Take as prescribed and with a probiotic.

## 2014-06-01 NOTE — Assessment & Plan Note (Signed)
POCT strep was positive. Augmentin twice daily for 10 days. Asked pt to take a probiotic, which she has at home. Pt verbalized understanding. Advised warm liquids for throat or OTC sore throat remedies. FU prn worsening/failure to improve.

## 2014-07-28 ENCOUNTER — Telehealth: Payer: Self-pay | Admitting: Family

## 2014-07-28 DIAGNOSIS — J0100 Acute maxillary sinusitis, unspecified: Secondary | ICD-10-CM

## 2014-07-28 MED ORDER — AMOXICILLIN-POT CLAVULANATE 875-125 MG PO TABS: 1.0000 | ORAL_TABLET | Freq: Two times a day (BID) | ORAL | Status: DC

## 2014-07-28 NOTE — Progress Notes (Signed)

## 2014-08-01 ENCOUNTER — Other Ambulatory Visit: Payer: Self-pay | Admitting: Internal Medicine

## 2014-09-02 ENCOUNTER — Other Ambulatory Visit: Payer: Self-pay | Admitting: Internal Medicine

## 2014-09-03 ENCOUNTER — Other Ambulatory Visit: Payer: Self-pay | Admitting: Internal Medicine

## 2014-09-07 ENCOUNTER — Encounter: Payer: Self-pay | Admitting: Internal Medicine

## 2014-09-07 ENCOUNTER — Ambulatory Visit (INDEPENDENT_AMBULATORY_CARE_PROVIDER_SITE_OTHER): Payer: BC Managed Care – PPO | Admitting: Internal Medicine

## 2014-09-07 VITALS — BP 120/76 | HR 101 | Temp 98.8°F | Ht 63.7 in | Wt 150.0 lb

## 2014-09-07 DIAGNOSIS — I10 Essential (primary) hypertension: Secondary | ICD-10-CM | POA: Diagnosis not present

## 2014-09-07 DIAGNOSIS — F4323 Adjustment disorder with mixed anxiety and depressed mood: Secondary | ICD-10-CM

## 2014-09-07 DIAGNOSIS — G43009 Migraine without aura, not intractable, without status migrainosus: Secondary | ICD-10-CM

## 2014-09-07 DIAGNOSIS — R5382 Chronic fatigue, unspecified: Secondary | ICD-10-CM | POA: Diagnosis not present

## 2014-09-07 LAB — CBC WITH DIFFERENTIAL/PLATELET
Basophils Absolute: 0 10*3/uL (ref 0.0–0.1)
Basophils Relative: 0.5 % (ref 0.0–3.0)
Eosinophils Absolute: 0.1 10*3/uL (ref 0.0–0.7)
Eosinophils Relative: 1.1 % (ref 0.0–5.0)
HCT: 36.2 % (ref 36.0–46.0)
Hemoglobin: 12.5 g/dL (ref 12.0–15.0)
Lymphocytes Relative: 56.7 % — ABNORMAL HIGH (ref 12.0–46.0)
Lymphs Abs: 3.3 10*3/uL (ref 0.7–4.0)
MCHC: 34.4 g/dL (ref 30.0–36.0)
MCV: 93.2 fl (ref 78.0–100.0)
Monocytes Absolute: 0.4 10*3/uL (ref 0.1–1.0)
Monocytes Relative: 6.4 % (ref 3.0–12.0)
Neutro Abs: 2 10*3/uL (ref 1.4–7.7)
Neutrophils Relative %: 35.3 % — ABNORMAL LOW (ref 43.0–77.0)
Platelets: 247 10*3/uL (ref 150.0–400.0)
RBC: 3.89 Mil/uL (ref 3.87–5.11)
RDW: 14.9 % (ref 11.5–15.5)
WBC: 5.8 10*3/uL (ref 4.0–10.5)

## 2014-09-07 LAB — LIPID PANEL
Cholesterol: 227 mg/dL — ABNORMAL HIGH (ref 0–200)
HDL: 42.8 mg/dL (ref >39.00)
LDL Cholesterol: 155 mg/dL — ABNORMAL HIGH (ref 0–99)
NonHDL: 184.2
Total CHOL/HDL Ratio: 5
Triglycerides: 147 mg/dL (ref 0.0–149.0)
VLDL: 29.4 mg/dL (ref 0.0–40.0)

## 2014-09-07 LAB — COMPREHENSIVE METABOLIC PANEL
ALT: 64 U/L — ABNORMAL HIGH (ref 0–35)
AST: 36 U/L (ref 0–37)
Albumin: 4.1 g/dL (ref 3.5–5.2)
Alkaline Phosphatase: 55 U/L (ref 39–117)
BUN: 6 mg/dL (ref 6–23)
CO2: 26 mEq/L (ref 19–32)
Calcium: 9.6 mg/dL (ref 8.4–10.5)
Chloride: 105 mEq/L (ref 96–112)
Creatinine, Ser: 0.68 mg/dL (ref 0.40–1.20)
GFR: 101.63 mL/min (ref >60.00)
Glucose, Bld: 85 mg/dL (ref 70–99)
Potassium: 3.6 mEq/L (ref 3.5–5.1)
Sodium: 138 mEq/L (ref 135–145)
Total Bilirubin: 1.2 mg/dL (ref 0.2–1.2)
Total Protein: 7.1 g/dL (ref 6.0–8.3)

## 2014-09-07 LAB — TSH: TSH: 1.22 u[IU]/mL (ref 0.35–4.50)

## 2014-09-07 MED ORDER — HYDROCHLOROTHIAZIDE 25 MG PO TABS: 25.0000 mg | ORAL_TABLET | Freq: Every day | ORAL | Status: DC

## 2014-09-07 MED ORDER — METOPROLOL SUCCINATE ER 50 MG PO TB24: 50.0000 mg | ORAL_TABLET | Freq: Every day | ORAL | Status: DC

## 2014-09-07 MED ORDER — RIZATRIPTAN BENZOATE 10 MG PO TABS: ORAL_TABLET | ORAL | Status: DC

## 2014-09-07 NOTE — Progress Notes (Signed)
Pre visit review using our clinic review tool, if applicable. No additional management support is needed unless otherwise documented below in the visit note. 

## 2014-09-07 NOTE — Assessment & Plan Note (Signed)
Migraines well controlled with only rare use of Maxalt. Will continue.

## 2014-09-07 NOTE — Assessment & Plan Note (Signed)
Recent generalized mild fatigue. Exam is normal. Will check CBC and TSH with labs.

## 2014-09-07 NOTE — Assessment & Plan Note (Signed)
Symptoms well controlled with Wellbutrin. Will continue. 

## 2014-09-07 NOTE — Assessment & Plan Note (Signed)
BP Readings from Last 3 Encounters:  09/07/14 120/76  06/01/14 108/70  09/16/13 109/74   BP well controlled. Will change bid metoprolol to daily Toprol XL for better compliance. Follow up in 6 months and prn. Renal function with labs.

## 2014-09-07 NOTE — Progress Notes (Signed)
Subjective:    Patient ID: Heather [name], female    DOB: [birth year], 39 y.o.   MRN: [id number]  HPI  39YO female presents for follow up.  Feeling tired over last few days. No fever, chills. No other symptoms. Has been out of Metoprolol and HCTZ for about 4 days. No CP, headache.  No recent headaches. However, with headaches since last visit, has had good improvement in symptoms with use of Maxalt.  Symptoms of anxiety/depression well controlled with use of Wellbutrin.  Past medical, surgical, family and social history per today's encounter.  Review of Systems  Constitutional: Positive for fatigue. Negative for fever, chills, appetite change and unexpected weight change.  Eyes: Negative for visual disturbance.  Respiratory: Negative for shortness of breath.   Cardiovascular: Negative for chest pain and leg swelling.  Gastrointestinal: Negative for nausea, vomiting, abdominal pain, diarrhea and constipation.  Musculoskeletal: Negative for myalgias and arthralgias.  Skin: Negative for color change and rash.  Hematological: Negative for adenopathy. Does not bruise/bleed easily.  Psychiatric/Behavioral: Negative for sleep disturbance and dysphoric mood. The patient is not nervous/anxious.        Objective:    BP 120/76 mmHg  Pulse 101  Temp(Src) 98.8 F (37.1 C) (Oral)  Ht 5' 3.7" (1.618 m)  Wt 150 lb (68.04 kg)  BMI 25.99 kg/m2  SpO2 100%  LMP 08/14/2014 Physical Exam  Constitutional: She is oriented to person, place, and time. She appears well-developed and well-nourished. No distress.  HENT:  Head: Normocephalic and atraumatic.  Right Ear: External ear normal.  Left Ear: External ear normal.  [name]: [name] normal.  Mouth/Throat: Oropharynx is clear and moist. No oropharyngeal exudate.  Eyes: Conjunctivae are normal. Pupils are equal, round, and reactive to light. Right eye exhibits no discharge. Left eye exhibits no discharge. No scleral icterus.  Neck: Normal  range of motion. Neck supple. No tracheal deviation present. No thyromegaly present.  Cardiovascular: Normal rate, regular rhythm, normal heart sounds and intact distal pulses.  Exam reveals no gallop and no friction rub.   No murmur heard. Pulmonary/Chest: Effort normal and breath sounds normal. No respiratory distress. She has no wheezes. She has no rales. She exhibits no tenderness.  Musculoskeletal: Normal range of motion. She exhibits no edema or tenderness.  Lymphadenopathy:    She has no cervical adenopathy.  Neurological: She is alert and oriented to person, place, and time. No cranial nerve deficit. She exhibits normal muscle tone. Coordination normal.  Skin: Skin is warm and dry. No rash noted. She is not diaphoretic. No erythema. No pallor.  Psychiatric: She has a normal mood and affect. Her behavior is normal. Judgment and thought content normal.          Assessment & Plan:   Problem List Items Addressed This Visit      Unprioritized   Adjustment disorder with mixed anxiety and depressed mood    Symptoms well controlled with Wellbutrin. Will continue.      Chronic fatigue    Recent generalized mild fatigue. Exam is normal. Will check CBC and TSH with labs.       Relevant Orders   CBC with Differential/Platelet   Lipid panel   TSH   Hypertension - Primary    BP Readings from Last 3 Encounters:  09/07/14 120/76  06/01/14 108/70  09/16/13 109/74   BP well controlled. Will change bid metoprolol to daily Toprol XL for better compliance. Follow up in 6 months and prn. Renal function with labs.  Relevant Medications   metoprolol succinate (TOPROL-XL) 50 MG 24 hr tablet   hydrochlorothiazide (HYDRODIURIL) 25 MG tablet   Other Relevant Orders   Comprehensive metabolic panel   Migraine    Migraines well controlled with only rare use of Maxalt. Will continue.      Relevant Medications   metoprolol succinate (TOPROL-XL) 50 MG 24 hr tablet   hydrochlorothiazide  (HYDRODIURIL) 25 MG tablet   rizatriptan (MAXALT) 10 MG tablet       Return in about 6 months (around 03/10/2015) for Physical.

## 2014-09-07 NOTE — Patient Instructions (Signed)
Stop twice daily metoprolol.  Start Toprol XL  daily.  Labs today.

## 2014-10-06 ENCOUNTER — Other Ambulatory Visit (INDEPENDENT_AMBULATORY_CARE_PROVIDER_SITE_OTHER): Payer: BC Managed Care – PPO

## 2014-10-06 ENCOUNTER — Telehealth: Payer: Self-pay | Admitting: *Deleted

## 2014-10-06 DIAGNOSIS — R7989 Other specified abnormal findings of blood chemistry: Secondary | ICD-10-CM

## 2014-10-06 DIAGNOSIS — R945 Abnormal results of liver function studies: Principal | ICD-10-CM

## 2014-10-06 LAB — COMPREHENSIVE METABOLIC PANEL
ALT: 19 U/L (ref 0–35)
AST: 19 U/L (ref 0–37)
Albumin: 4.8 g/dL (ref 3.5–5.2)
Alkaline Phosphatase: 51 U/L (ref 39–117)
BUN: 12 mg/dL (ref 6–23)
CO2: 28 mEq/L (ref 19–32)
Calcium: 10 mg/dL (ref 8.4–10.5)
Chloride: 99 mEq/L (ref 96–112)
Creatinine, Ser: 0.83 mg/dL (ref 0.40–1.20)
GFR: 80.71 mL/min (ref >60.00)
Glucose, Bld: 81 mg/dL (ref 70–99)
Potassium: 3.7 mEq/L (ref 3.5–5.1)
Sodium: 135 mEq/L (ref 135–145)
Total Bilirubin: 1.1 mg/dL (ref 0.2–1.2)
Total Protein: 7.8 g/dL (ref 6.0–8.3)

## 2014-10-06 NOTE — Telephone Encounter (Signed)
Labs and dx?  

## 2014-10-06 NOTE — Telephone Encounter (Signed)
CMP for elevated lfts 

## 2014-10-26 ENCOUNTER — Encounter: Payer: Self-pay | Admitting: Internal Medicine

## 2014-11-17 ENCOUNTER — Encounter: Payer: Self-pay | Admitting: Internal Medicine

## 2014-11-18 ENCOUNTER — Other Ambulatory Visit: Payer: Self-pay | Admitting: *Deleted

## 2014-11-18 MED ORDER — BUPROPION HCL ER (XL) 150 MG PO TB24: 150.0000 mg | ORAL_TABLET | Freq: Every day | ORAL | Status: DC

## 2015-01-16 ENCOUNTER — Telehealth: Payer: Self-pay | Admitting: Physician Assistant

## 2015-01-16 DIAGNOSIS — B9689 Other specified bacterial agents as the cause of diseases classified elsewhere: Secondary | ICD-10-CM

## 2015-01-16 DIAGNOSIS — J019 Acute sinusitis, unspecified: Secondary | ICD-10-CM

## 2015-01-16 MED ORDER — AMOXICILLIN-POT CLAVULANATE 875-125 MG PO TABS: 1.0000 | ORAL_TABLET | Freq: Two times a day (BID) | ORAL | Status: DC

## 2015-01-16 NOTE — Progress Notes (Signed)
We are sorry that you are not feeling well.  Here is how we plan to help!  Based on what you have shared with me it looks like you have sinusitis.  Sinusitis is inflammation and infection in the sinus cavities of the head.  Based on your presentation I believe you most likely have Acute Bacterial Sinusitis.  This is an infection caused by bacteria and is treated with antibiotics. I have prescribed Augmentin, an antibiotic in the penicillin family, one tablet twice daily with food, for 7 days. You may use an oral decongestant such as Mucinex D or if you have glaucoma or high blood pressure use plain Mucinex. Saline nasal spray help and can safely be used as often as needed for congestion.  If you develop worsening sinus pain, fever or notice severe headache and vision changes, or if symptoms are not better after completion of antibiotic, please schedule an appointment with a health care provider.    Sinus infections are not as easily transmitted as other respiratory infection, however we still recommend that you avoid close contact with loved ones, especially the very young and elderly.  Remember to wash your hands thoroughly throughout the day as this is the number one way to prevent the spread of infection!  Home Care:  Only take medications as instructed by your medical team.  Complete the entire course of an antibiotic.  Do not take these medications with alcohol.  A steam or ultrasonic humidifier can help congestion.  You can place a towel over your head and breathe in the steam from hot water coming from a faucet.  Avoid close contacts especially the very young and the elderly.  Cover your mouth when you cough or sneeze.  Always remember to wash your hands.  Get Help Right Away If:  You develop worsening fever or sinus pain.  You develop a severe head ache or visual changes.  Your symptoms persist after you have completed your treatment plan.  Make sure you  Understand these  instructions.  Will watch your condition.  Will get help right away if you are not doing well or get worse.  Your e-visit answers were reviewed by a board certified advanced clinical practitioner to complete your personal care plan.  Depending on the condition, your plan could have included both over the counter or prescription medications.  If there is a problem please reply  once you have received a response from your provider.  Your safety is important to us.  If you have drug allergies check your prescription carefully.    You can use MyChart to ask questions about today's visit, request a non-urgent call back, or ask for a work or school excuse for 24 hours related to this e-Visit. If it has been greater than 24 hours you will need to follow up with your provider, or enter a new e-Visit to address those concerns.  You will get an e-mail in the next two days asking about your experience.  I hope that your e-visit has been valuable and will speed your recovery. Thank you for using e-visits.    

## 2015-02-04 ENCOUNTER — Telehealth: Payer: Self-pay | Admitting: Family

## 2015-02-04 DIAGNOSIS — J029 Acute pharyngitis, unspecified: Secondary | ICD-10-CM

## 2015-02-04 MED ORDER — BENZONATATE 100 MG PO CAPS: 100.0000 mg | ORAL_CAPSULE | Freq: Three times a day (TID) | ORAL | Status: DC | PRN

## 2015-02-04 MED ORDER — AZITHROMYCIN 250 MG PO TABS: ORAL_TABLET | ORAL | Status: DC

## 2015-02-04 NOTE — Progress Notes (Signed)

## 2015-10-10 ENCOUNTER — Ambulatory Visit (INDEPENDENT_AMBULATORY_CARE_PROVIDER_SITE_OTHER): Payer: BC Managed Care – PPO | Admitting: Family

## 2015-10-10 ENCOUNTER — Encounter: Payer: Self-pay | Admitting: Family

## 2015-10-10 VITALS — BP 122/72 | HR 84 | Temp 98.3°F | Resp 16 | Ht 63.0 in | Wt 141.0 lb

## 2015-10-10 DIAGNOSIS — I10 Essential (primary) hypertension: Secondary | ICD-10-CM | POA: Diagnosis not present

## 2015-10-10 DIAGNOSIS — G43009 Migraine without aura, not intractable, without status migrainosus: Secondary | ICD-10-CM

## 2015-10-10 DIAGNOSIS — Z0000 Encounter for general adult medical examination without abnormal findings: Secondary | ICD-10-CM

## 2015-10-10 LAB — HIV ANTIBODY (ROUTINE TESTING W REFLEX): HIV 1&2 Ab, 4th Generation: NONREACTIVE

## 2015-10-10 MED ORDER — HYDROCHLOROTHIAZIDE 25 MG PO TABS: 25.0000 mg | ORAL_TABLET | Freq: Every day | ORAL | 3 refills | Status: DC

## 2015-10-10 MED ORDER — RIZATRIPTAN BENZOATE 10 MG PO TABS: ORAL_TABLET | ORAL | 5 refills | Status: DC

## 2015-10-10 MED ORDER — METOPROLOL SUCCINATE ER 50 MG PO TB24: 50.0000 mg | ORAL_TABLET | Freq: Every day | ORAL | 3 refills | Status: DC

## 2015-10-10 NOTE — Patient Instructions (Signed)
pleasure meeting you. Please ensure that she had her Pap and mammogram done this year with your GYN.  Health Maintenance, Female Adopting a healthy lifestyle and getting preventive care can go a long way to promote health and wellness. Talk with your health care provider about what schedule of regular examinations is right for you. This is a good chance for you to check in with your provider about disease prevention and staying healthy. In between checkups, there are plenty of things you can do on your own. Experts have done a lot of research about which lifestyle changes and preventive measures are most likely to keep you healthy. Ask your health care provider for more information. WEIGHT AND DIET  Eat a healthy diet  Be sure to include plenty of vegetables, fruits, low-fat dairy products, and lean protein.  Do not eat a lot of foods high in solid fats, added sugars, or salt.  Get regular exercise. This is one of the most important things you can do for your health.  Most adults should exercise for at least 150 minutes each week. The exercise should increase your heart rate and make you sweat (moderate-intensity exercise).  Most adults should also do strengthening exercises at least twice a week. This is in addition to the moderate-intensity exercise.  Maintain a healthy weight  Body mass index (BMI) is a measurement that can be used to identify possible weight problems. It estimates body fat based on height and weight. Your health care provider can help determine your BMI and help you achieve or maintain a healthy weight.  For females 63 years of age and older:   A BMI below 18.5 is considered underweight.  A BMI of 18.5 to 24.9 is normal.  A BMI of 25 to 29.9 is considered overweight.  A BMI of 30 and above is considered obese.  Watch levels of cholesterol and blood lipids  You should start having your blood tested for lipids and cholesterol at 41 years of age, then have this  test every 5 years.  You may need to have your cholesterol levels checked more often if:  Your lipid or cholesterol levels are high.  You are older than 41 years of age.  You are at high risk for heart disease.  CANCER SCREENING   Lung Cancer  Lung cancer screening is recommended for adults 37-37 years old who are at high risk for lung cancer because of a history of smoking.  A yearly low-dose CT scan of the lungs is recommended for people who:  Currently smoke.  Have quit within the past 15 years.  Have at least a 30-pack-year history of smoking. A pack year is smoking an average of one pack of cigarettes a day for 1 year.  Yearly screening should continue until it has been 15 years since you quit.  Yearly screening should stop if you develop a health problem that would prevent you from having lung cancer treatment.  Breast Cancer  Practice breast self-awareness. This means understanding how your breasts normally appear and feel.  It also means doing regular breast self-exams. Let your health care provider know about any changes, no matter how small.  If you are in your 20s or 30s, you should have a clinical breast exam (CBE) by a health care provider every 1-3 years as part of a regular health exam.  If you are 94 or older, have a CBE every year. Also consider having a breast X-ray (mammogram) every year.  If you  have a family history of breast cancer, talk to your health care provider about genetic screening.  If you are at high risk for breast cancer, talk to your health care provider about having an MRI and a mammogram every year.  Breast cancer gene (BRCA) assessment is recommended for women who have family members with BRCA-related cancers. BRCA-related cancers include:  Breast.  Ovarian.  Tubal.  Peritoneal cancers.  Results of the assessment will determine the need for genetic counseling and BRCA1 and BRCA2 testing. Cervical Cancer Your health care  provider may recommend that you be screened regularly for cancer of the pelvic organs (ovaries, uterus, and vagina). This screening involves a pelvic examination, including checking for microscopic changes to the surface of your cervix (Pap test). You may be encouraged to have this screening done every 3 years, beginning at age 66.  For women ages 76-65, health care providers may recommend pelvic exams and Pap testing every 3 years, or they may recommend the Pap and pelvic exam, combined with testing for human papilloma virus (HPV), every 5 years. Some types of HPV increase your risk of cervical cancer. Testing for HPV may also be done on women of any age with unclear Pap test results.  Other health care providers may not recommend any screening for nonpregnant women who are considered low risk for pelvic cancer and who do not have symptoms. Ask your health care provider if a screening pelvic exam is right for you.  If you have had past treatment for cervical cancer or a condition that could lead to cancer, you need Pap tests and screening for cancer for at least 20 years after your treatment. If Pap tests have been discontinued, your risk factors (such as having a new sexual partner) need to be reassessed to determine if screening should resume. Some women have medical problems that increase the chance of getting cervical cancer. In these cases, your health care provider may recommend more frequent screening and Pap tests. Colorectal Cancer  This type of cancer can be detected and often prevented.  Routine colorectal cancer screening usually begins at 41 years of age and continues through 41 years of age.  Your health care provider may recommend screening at an earlier age if you have risk factors for colon cancer.  Your health care provider may also recommend using home test kits to check for hidden blood in the stool.  A small camera at the end of a tube can be used to examine your colon directly  (sigmoidoscopy or colonoscopy). This is done to check for the earliest forms of colorectal cancer.  Routine screening usually begins at age 42.  Direct examination of the colon should be repeated every 5-10 years through 41 years of age. However, you may need to be screened more often if early forms of precancerous polyps or small growths are found. Skin Cancer  Check your skin from head to toe regularly.  Tell your health care provider about any new moles or changes in moles, especially if there is a change in a mole's shape or color.  Also tell your health care provider if you have a mole that is larger than the size of a pencil eraser.  Always use sunscreen. Apply sunscreen liberally and repeatedly throughout the day.  Protect yourself by wearing long sleeves, pants, a wide-brimmed hat, and sunglasses whenever you are outside. HEART DISEASE, DIABETES, AND HIGH BLOOD PRESSURE   High blood pressure causes heart disease and increases the risk of stroke.  High blood pressure is more likely to develop in:  People who have blood pressure in the high end of the normal range (130-139/85-89 mm Hg).  People who are overweight or obese.  People who are African American.  If you are 42-104 years of age, have your blood pressure checked every 3-5 years. If you are 60 years of age or older, have your blood pressure checked every year. You should have your blood pressure measured twice--once when you are at a hospital or clinic, and once when you are not at a hospital or clinic. Record the average of the two measurements. To check your blood pressure when you are not at a hospital or clinic, you can use:  An automated blood pressure machine at a pharmacy.  A home blood pressure monitor.  If you are between 18 years and 59 years old, ask your health care provider if you should take aspirin to prevent strokes.  Have regular diabetes screenings. This involves taking a blood sample to check your  fasting blood sugar level.  If you are at a normal weight and have a low risk for diabetes, have this test once every three years after 41 years of age.  If you are overweight and have a high risk for diabetes, consider being tested at a younger age or more often. PREVENTING INFECTION  Hepatitis B  If you have a higher risk for hepatitis B, you should be screened for this virus. You are considered at high risk for hepatitis B if:  You were born in a country where hepatitis B is common. Ask your health care provider which countries are considered high risk.  Your parents were born in a high-risk country, and you have not been immunized against hepatitis B (hepatitis B vaccine).  You have HIV or AIDS.  You use needles to inject street drugs.  You live with someone who has hepatitis B.  You have had sex with someone who has hepatitis B.  You get hemodialysis treatment.  You take certain medicines for conditions, including cancer, organ transplantation, and autoimmune conditions. Hepatitis C  Blood testing is recommended for:  Everyone born from [birth year] through 1966.  Anyone with known risk factors for hepatitis C. Sexually transmitted infections (STIs)  You should be screened for sexually transmitted infections (STIs) including gonorrhea and chlamydia if:  You are sexually active and are younger than 41 years of age.  You are older than 41 years of age and your health care provider tells you that you are at risk for this type of infection.  Your sexual activity has changed since you were last screened and you are at an increased risk for chlamydia or gonorrhea. Ask your health care provider if you are at risk.  If you do not have HIV, but are at risk, it may be recommended that you take a prescription medicine daily to prevent HIV infection. This is called pre-exposure prophylaxis (PrEP). You are considered at risk if:  You are sexually active and do not regularly use condoms or  know the HIV status of your partner(s).  You take drugs by injection.  You are sexually active with a partner who has HIV. Talk with your health care provider about whether you are at high risk of being infected with HIV. If you choose to begin PrEP, you should first be tested for HIV. You should then be tested every 3 months for as long as you are taking PrEP.  PREGNANCY   If you  are premenopausal and you may become pregnant, ask your health care provider about preconception counseling.  If you may become pregnant, take 400 to 800 micrograms (mcg) of folic acid every day.  If you want to prevent pregnancy, talk to your health care provider about birth control (contraception). OSTEOPOROSIS AND MENOPAUSE   Osteoporosis is a disease in which the bones lose minerals and strength with aging. This can result in serious bone fractures. Your risk for osteoporosis can be identified using a bone density scan.  If you are 64 years of age or older, or if you are at risk for osteoporosis and fractures, ask your health care provider if you should be screened.  Ask your health care provider whether you should take a calcium or vitamin D supplement to lower your risk for osteoporosis.  Menopause may have certain physical symptoms and risks.  Hormone replacement therapy may reduce some of these symptoms and risks. Talk to your health care provider about whether hormone replacement therapy is right for you.  HOME CARE INSTRUCTIONS   Schedule regular health, dental, and eye exams.  Stay current with your immunizations.   Do not use any tobacco products including cigarettes, chewing tobacco, or electronic cigarettes.  If you are pregnant, do not drink alcohol.  If you are breastfeeding, limit how much and how often you drink alcohol.  Limit alcohol intake to no more than 1 drink per day for nonpregnant women. One drink equals 12 ounces of beer, 5 ounces of wine, or 1 ounces of hard liquor.  Do  not use street drugs.  Do not share needles.  Ask your health care provider for help if you need support or information about quitting drugs.  Tell your health care provider if you often feel depressed.  Tell your health care provider if you have ever been abused or do not feel safe at home.   This information is not intended to replace advice given to you by your health care provider. Make sure you discuss any questions you have with your health care provider.   Document Released: 08/21/2010 Document Revised: 02/26/2014 Document Reviewed: 01/07/2013 Elsevier Interactive Patient Education [employer].

## 2015-10-10 NOTE — Assessment & Plan Note (Addendum)
Screening labs today. Follows with GYN for Pap and mammogram. Immunizations UTD.

## 2015-10-10 NOTE — Assessment & Plan Note (Signed)
At goal. Not on medications today. Patient prefers to stay on medications and not trial coming off due to strong family h/o CVA and HTN.

## 2015-10-10 NOTE — Assessment & Plan Note (Signed)
Stable. Well controlled on maxalt. No aura or hemiplegia with HA.

## 2015-10-10 NOTE — Progress Notes (Signed)
Subjective:    Patient ID: Heather [name], female    DOB: April 06, 1975, 40 y.o.   MRN: [id number]  CC: Heather [name] is a 40 y.o. female who presents today to establish care.  HPI: Patient here to establish care. Prior she been seen by Dr. Dan [name] however not this past year. She follows GYN which is where her Pap and mammogram are done, she will make those appointments herself.  She feels well and has no complaints today.  Migraine history- No aura. No changes in vision or numbness to face, extremities. Has had HA with menstrual cycles for a decade. Has HA 2x per month. Takes Maxalt with resolve. HA's feel similar to ones in the past.   HTN- Well controlled. Hasn't taken medications for 5 days. No chest pain, palpitations. No fatigue, lightheadedness from medications.       HISTORY:  Past Medical History:  Diagnosis Date  . Hyperlipidemia   . Hypertension   . Nail fungus 06.14.2014   Past Surgical History:  Procedure Laterality Date  . VAGINAL DELIVERY     Family History  Problem Relation Age of Onset  . Hypertension Mother   . Heart disease Father   . Heart disease Maternal Grandmother   . Heart disease Maternal Grandfather   . CVA Paternal Grandmother     Allergies: Review of patient's allergies indicates no known allergies. Current Outpatient Prescriptions on File Prior to Visit  Medication Sig Dispense Refill  . Calcium Carbonate (CALTRATE 600 PO) Take by mouth daily.     No current facility-administered medications on file prior to visit.     Social History  Substance Use Topics  . Smoking status: Never Smoker  . Smokeless tobacco: Never Used  . Alcohol use No    Review of Systems  Constitutional: Negative for chills, fever and unexpected weight change.  HENT: Negative for congestion.   Respiratory: Negative for cough.   Cardiovascular: Negative for chest pain, palpitations and leg swelling.  Gastrointestinal: Negative for nausea and vomiting.    Musculoskeletal: Negative for arthralgias and myalgias.  Skin: Negative for rash.  Neurological: Negative for light-headedness, numbness and headaches.  Hematological: Negative for adenopathy.  Psychiatric/Behavioral: Negative for confusion.      Objective:    BP 122/72 (BP Location: Right Arm, Patient Position: Sitting, Cuff Size: Large)   Pulse 84   Temp 98.3 F (36.8 C) (Oral)   Resp 16   Ht 5\' 3"  (1.6 m)   Wt 141 lb (64 kg)   SpO2 99%   BMI 24.98 kg/m  BP Readings from Last 3 Encounters:  10/10/15 122/72  09/07/14 120/76  06/01/14 108/70   Wt Readings from Last 3 Encounters:  10/10/15 141 lb (64 kg)  09/07/14 150 lb (68 kg)  06/01/14 147 lb 12.8 oz (67 kg)    Physical Exam  Constitutional: She appears well-developed and well-nourished.  Eyes: Conjunctivae are normal.  Neck: No thyroid mass and no thyromegaly present.  Cardiovascular: Normal rate, regular rhythm, normal heart sounds and normal pulses.   Pulmonary/Chest: Effort normal and breath sounds normal. She has no wheezes. She has no rhonchi. She has no rales. Right breast exhibits no inverted nipple, no mass, no nipple discharge, no skin change and no tenderness. Left breast exhibits no inverted nipple, no mass, no nipple discharge, no skin change and no tenderness. Breasts are symmetrical.  CBE performed.   Lymphadenopathy:       Head (right side): No submental, no submandibular, no  tonsillar, no preauricular, no posterior auricular and no occipital adenopathy present.       Head (left side): No submental, no submandibular, no tonsillar, no preauricular, no posterior auricular and no occipital adenopathy present.    She has no cervical adenopathy.       Right cervical: No superficial cervical, no deep cervical and no posterior cervical adenopathy present.      Left cervical: No superficial cervical, no deep cervical and no posterior cervical adenopathy present.    She has no axillary adenopathy.  Neurological:  She is alert.  Skin: Skin is warm and dry.  Psychiatric: She has a normal mood and affect. Her speech is normal and behavior is normal. Thought content normal.  Vitals reviewed.      Assessment & Plan:   Problem List Items Addressed This Visit      Cardiovascular and Mediastinum   Hypertension    At goal. Not on medications today. Patient prefers to stay on medications and not trial coming off due to strong family h/o CVA and HTN.       Relevant Medications   hydrochlorothiazide (HYDRODIURIL) 25 MG tablet   metoprolol succinate (TOPROL-XL) 50 MG 24 hr tablet   Migraine    Stable. Well controlled on maxalt. No aura or hemiplegia with HA.      Relevant Medications   hydrochlorothiazide (HYDRODIURIL) 25 MG tablet   metoprolol succinate (TOPROL-XL) 50 MG 24 hr tablet   rizatriptan (MAXALT) 10 MG tablet    Other Visit Diagnoses    Routine physical examination    -  Primary   Relevant Medications   hydrochlorothiazide (HYDRODIURIL) 25 MG tablet   metoprolol succinate (TOPROL-XL) 50 MG 24 hr tablet   rizatriptan (MAXALT) 10 MG tablet   Other Relevant Orders   CBC with Differential/Platelet   Comprehensive metabolic panel   Hemoglobin A1c   Lipid panel   TSH   VITAMIN D 25 Hydroxy (Vit-D Deficiency, Fractures)   HIV antibody       I have discontinued Ms. Miu's buPROPion, amoxicillin-clavulanate, benzonatate, and azithromycin. I am also having her maintain her Calcium Carbonate (CALTRATE 600 PO), hydrochlorothiazide, metoprolol succinate, and rizatriptan.   Meds ordered this encounter  Medications  . hydrochlorothiazide (HYDRODIURIL) 25 MG tablet    Sig: Take 1 tablet (25 mg total) by mouth daily.    Dispense:  90 tablet    Refill:  3    Order Specific Question:   Supervising Provider    Answer:   Heather [name] [[id number]]  . metoprolol succinate (TOPROL-XL) 50 MG 24 hr tablet    Sig: Take 1 tablet (50 mg total) by mouth daily. Take with or immediately following a  meal.    Dispense:  90 tablet    Refill:  3    Order Specific Question:   Supervising Provider    Answer:   Heather [name] [[id number]]  . rizatriptan (MAXALT) 10 MG tablet    Sig: TAKE 1 TABLET BY MOUTH AS NEEDED FOR MIGRAINE. MAY REPEAT IN 2 HOURS IF NEEDED    Dispense:  10 tablet    Refill:  5    Order Specific Question:   Supervising Provider    Answer:   Sherlene ShamsULLO, TERESA [name] [[id number]]    Return precautions given.   Risks, benefits, and alternatives of the medications and treatment plan prescribed today were discussed, and patient expressed understanding.   Education regarding symptom management and diagnosis given to patient on AVS.  Continue to  follow with Rennie PlowmanMargaret Goldie Dimmer, FNP for routine health maintenance.   Heather NoseAmanda G Wunder and I agreed with plan.   Rennie PlowmanMargaret Dekota Kirlin, FNP

## 2015-10-11 ENCOUNTER — Encounter: Payer: Self-pay | Admitting: Family

## 2015-10-11 LAB — COMPREHENSIVE METABOLIC PANEL
ALT: 10 U/L (ref 0–35)
AST: 16 U/L (ref 0–37)
Albumin: 4.6 g/dL (ref 3.5–5.2)
Alkaline Phosphatase: 43 U/L (ref 39–117)
BUN: 7 mg/dL (ref 6–23)
CO2: 27 mEq/L (ref 19–32)
Calcium: 9.3 mg/dL (ref 8.4–10.5)
Chloride: 102 mEq/L (ref 96–112)
Creatinine, Ser: 0.86 mg/dL (ref 0.40–1.20)
GFR: 77.08 mL/min (ref >60.00)
Glucose, Bld: 92 mg/dL (ref 70–99)
Potassium: 3.9 mEq/L (ref 3.5–5.1)
Sodium: 137 mEq/L (ref 135–145)
Total Bilirubin: 0.9 mg/dL (ref 0.2–1.2)
Total Protein: 7.6 g/dL (ref 6.0–8.3)

## 2015-10-11 LAB — LIPID PANEL
Cholesterol: 273 mg/dL — ABNORMAL HIGH (ref 0–200)
HDL: 54.7 mg/dL (ref >39.00)
LDL Cholesterol: 200 mg/dL — ABNORMAL HIGH (ref 0–99)
NonHDL: 218.19
Total CHOL/HDL Ratio: 5
Triglycerides: 91 mg/dL (ref 0.0–149.0)
VLDL: 18.2 mg/dL (ref 0.0–40.0)

## 2015-10-11 LAB — TSH: TSH: 1.74 u[IU]/mL (ref 0.35–4.50)

## 2015-10-11 LAB — CBC WITH DIFFERENTIAL/PLATELET
Basophils Absolute: 0.1 10*3/uL (ref 0.0–0.1)
Basophils Relative: 1 % (ref 0.0–3.0)
Eosinophils Absolute: 0.1 10*3/uL (ref 0.0–0.7)
Eosinophils Relative: 1.7 % (ref 0.0–5.0)
HCT: 40.5 % (ref 36.0–46.0)
Hemoglobin: 14 g/dL (ref 12.0–15.0)
Lymphocytes Relative: 44.8 % (ref 12.0–46.0)
Lymphs Abs: 2.8 10*3/uL (ref 0.7–4.0)
MCHC: 34.7 g/dL (ref 30.0–36.0)
MCV: 89.1 fl (ref 78.0–100.0)
Monocytes Absolute: 0.2 10*3/uL (ref 0.1–1.0)
Monocytes Relative: 3.9 % (ref 3.0–12.0)
Neutro Abs: 3.1 10*3/uL (ref 1.4–7.7)
Neutrophils Relative %: 48.6 % (ref 43.0–77.0)
Platelets: 288 10*3/uL (ref 150.0–400.0)
RBC: 4.54 Mil/uL (ref 3.87–5.11)
RDW: 12.5 % (ref 11.5–15.5)
WBC: 6.3 10*3/uL (ref 4.0–10.5)

## 2015-10-11 LAB — HEMOGLOBIN A1C: Hgb A1c MFr Bld: 5.1 % (ref 4.6–6.5)

## 2015-10-11 LAB — VITAMIN D 25 HYDROXY (VIT D DEFICIENCY, FRACTURES): VITD: 26.52 ng/mL — ABNORMAL LOW (ref 30.00–100.00)

## 2016-03-12 ENCOUNTER — Encounter: Payer: Self-pay | Admitting: Family

## 2016-03-12 ENCOUNTER — Ambulatory Visit (INDEPENDENT_AMBULATORY_CARE_PROVIDER_SITE_OTHER): Payer: BC Managed Care – PPO | Admitting: Family

## 2016-03-12 ENCOUNTER — Ambulatory Visit: Payer: BC Managed Care – PPO | Admitting: Family

## 2016-03-12 VITALS — BP 126/86 | HR 90 | Temp 98.0°F | Ht 63.0 in | Wt 150.6 lb

## 2016-03-12 DIAGNOSIS — R21 Rash and other nonspecific skin eruption: Secondary | ICD-10-CM | POA: Diagnosis not present

## 2016-03-12 MED ORDER — CLOTRIMAZOLE 1 % EX CREA
1.0000 | TOPICAL_CREAM | Freq: Two times a day (BID) | CUTANEOUS | 1 refills | Status: DC
Start: 2016-03-12 — End: 2016-06-19

## 2016-03-12 NOTE — Progress Notes (Signed)
Pre visit review using our clinic review tool, if applicable. No additional management support is needed unless otherwise documented below in the visit note. 

## 2016-03-12 NOTE — Progress Notes (Signed)
Subjective:    Patient ID: Heather [name], female    DOB: 06-07-1975, 41 y.o.   MRN: [id number]  CC: Heather [name] is a 41 y.o. female who presents today for an acute visit.    HPI: CC: rash x 2 months  Left upper eyelid which resolved. Describes as burning. Now spread to both eyes and hands. Worsening. Tried benadryl, moisturizers, and hydrocortisone with no relief.  No fever. No new lotions, creams. No eczema, asthma, environmental allergies  Gluten allergy.     HISTORY:  Past Medical History:  Diagnosis Date  . Hyperlipidemia   . Hypertension   . Nail fungus 06.14.2014   Past Surgical History:  Procedure Laterality Date  . VAGINAL DELIVERY     Family History  Problem Relation Age of Onset  . Hypertension Mother   . Heart disease Father   . Heart disease Maternal Grandmother   . Heart disease Maternal Grandfather   . CVA Paternal Grandmother     Allergies: Patient has no known allergies. Current Outpatient Prescriptions on File Prior to Visit  Medication Sig Dispense Refill  . Calcium Carbonate (CALTRATE 600 PO) Take by mouth daily.    . hydrochlorothiazide (HYDRODIURIL) 25 MG tablet Take 1 tablet (25 mg total) by mouth daily. 90 tablet 3  . metoprolol succinate (TOPROL-XL) 50 MG 24 hr tablet Take 1 tablet (50 mg total) by mouth daily. Take with or immediately following a meal. 90 tablet 3  . rizatriptan (MAXALT) 10 MG tablet TAKE 1 TABLET BY MOUTH AS NEEDED FOR MIGRAINE. MAY REPEAT IN 2 HOURS IF NEEDED 10 tablet 5   No current facility-administered medications on file prior to visit.     Social History  Substance Use Topics  . Smoking status: Never Smoker  . Smokeless tobacco: Never Used  . Alcohol use No    Review of Systems  Constitutional: Negative for chills and fever.  Respiratory: Negative for cough.   Cardiovascular: Negative for chest pain and palpitations.  Gastrointestinal: Negative for nausea and vomiting.  Skin: Positive for rash.       Objective:    BP 126/86   Pulse 90   Temp 98 F (36.7 C) (Oral)   Ht 5\' 3"  (1.6 m)   Wt 150 lb 9.6 oz (68.3 kg)   LMP 03/02/2016 (Exact Date)   SpO2 97%   BMI 26.68 kg/m   Wt Readings from Last 3 Encounters:  03/12/16 150 lb 9.6 oz (68.3 kg)  10/10/15 141 lb (64 kg)  09/07/14 150 lb (68 kg)    Physical Exam  Constitutional: She appears well-developed and well-nourished.  Eyes: Conjunctivae are normal.  Cardiovascular: Normal rate, regular rhythm, normal heart sounds and normal pulses.   Pulmonary/Chest: Effort normal and breath sounds normal. She has no wheezes. She has no rhonchi. She has no rales.  Neurological: She is alert.  Skin: Skin is warm and dry. There is erythema.  2 flakey, circular erythematous lesions on right cheek of face. Localized erythema and circular lesion left eyelid. Localized erythematous circular lesion right dorsal aspect hand. No vesicular lesions, discharge. No increased warmth.  Psychiatric: She has a normal mood and affect. Her speech is normal and behavior is normal. Thought content normal.  Vitals reviewed.      Assessment & Plan:   1. Rash and nonspecific skin eruption Rash failed over-the-counter hydrocortisone cream. Most consistent with tinea. Trial of clotrimazole. It does not respond, patient aND I discussed prescription strength, short course hydrocortisone  cream. Return precautions given.  - clotrimazole (LOTRIMIN) 1 % cream; Apply 1 application topically 2 (two) times daily.  Dispense: 30 g; Refill: 1    I am having [name] maintain her Calcium Carbonate (CALTRATE 600 PO), hydrochlorothiazide, metoprolol succinate, and rizatriptan.   No orders of the defined types were placed in this encounter.   Return precautions given.   Risks, benefits, and alternatives of the medications and treatment plan prescribed today were discussed, and patient expressed understanding.   Education regarding symptom management and  diagnosis given to patient on AVS.  Continue to follow with Heather PlowmanMargaret Reyah Streeter, FNP for routine health maintenance.   Heather NoseAmanda G [name] and I agreed with plan.   Heather PlowmanMargaret Minnie Shi, FNP

## 2016-03-12 NOTE — Patient Instructions (Signed)
Suspect tinea.   Let me know if not better

## 2016-06-05 ENCOUNTER — Encounter: Payer: BC Managed Care – PPO | Admitting: Obstetrics & Gynecology

## 2016-06-19 ENCOUNTER — Other Ambulatory Visit: Payer: Self-pay | Admitting: Obstetrics & Gynecology

## 2016-06-19 ENCOUNTER — Encounter: Payer: Self-pay | Admitting: Obstetrics & Gynecology

## 2016-06-19 ENCOUNTER — Ambulatory Visit (INDEPENDENT_AMBULATORY_CARE_PROVIDER_SITE_OTHER): Payer: BC Managed Care – PPO | Admitting: Obstetrics & Gynecology

## 2016-06-19 VITALS — BP 134/86 | HR 87 | Ht 63.0 in | Wt 150.0 lb

## 2016-06-19 DIAGNOSIS — Z1231 Encounter for screening mammogram for malignant neoplasm of breast: Secondary | ICD-10-CM

## 2016-06-19 DIAGNOSIS — Z01419 Encounter for gynecological examination (general) (routine) without abnormal findings: Secondary | ICD-10-CM

## 2016-06-19 NOTE — Patient Instructions (Signed)

## 2016-06-19 NOTE — Progress Notes (Signed)
GYNECOLOGY ANNUAL PREVENTATIVE CARE ENCOUNTER NOTE  Subjective:   Heather [name] is a 41 y.o. G61P1001 female here for a routine annual gynecologic exam.  Current complaints: none.   Denies abnormal vaginal bleeding, discharge, pelvic pain, problems with intercourse or other gynecologic concerns.    Gynecologic History Patient's last menstrual period was 06/04/2016. Contraception: vasectomy Last Pap: 2013. Results were: normal Never had a mammogram.  Obstetric History OB History  Gravida Para Term Preterm AB Living  SAB TAB Ectopic Multiple Live Births          1    # Outcome Date GA Lbr Len/2nd Weight Sex Delivery Anes PTL Lv  1 Term 03/10/93     Vag-Spont   LIV      Past Medical History:  Diagnosis Date  . Hyperlipidemia   . Hypertension   . Nail fungus 06.14.2014    Past Surgical History:  Procedure Laterality Date  . VAGINAL DELIVERY      Current Outpatient Prescriptions on File Prior to Visit  Medication Sig Dispense Refill  . hydrochlorothiazide (HYDRODIURIL) 25 MG tablet Take 1 tablet (25 mg total) by mouth daily. 90 tablet 3  . metoprolol succinate (TOPROL-XL) 50 MG 24 hr tablet Take 1 tablet (50 mg total) by mouth daily. Take with or immediately following a meal. 90 tablet 3  . rizatriptan (MAXALT) 10 MG tablet TAKE 1 TABLET BY MOUTH AS NEEDED FOR MIGRAINE. MAY REPEAT IN 2 HOURS IF NEEDED 10 tablet 5   No current facility-administered medications on file prior to visit.     Allergies  Allergen Reactions  . Gluten Meal Diarrhea    Social History   Social History  . Marital status: Married    Spouse name: N/A  . Number of children: N/A  . Years of education: N/A   Occupational History  . Not on file.   Social History Main Topics  . Smoking status: Never Smoker  . Smokeless tobacco: Never Used  . Alcohol use No  . Drug use: No  . Sexual activity: Yes    Partners: Male    Birth control/ protection: Surgical   Other  Topics Concern  . Not on file   Social History Narrative   Lives with daughter and husband in [place]. Works as Estate manager/land agent.   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     Family History  Problem Relation Age of Onset  . Hypertension Mother   . Heart disease Father   . Heart disease Maternal Grandmother   . Heart disease Maternal Grandfather   . CVA Paternal Grandmother     The following portions of the patient's history were reviewed and updated as appropriate: allergies, current medications, past family history, past medical history, past social history, past surgical history and problem list.  Review of Systems Pertinent items noted in HPI and remainder of comprehensive ROS otherwise negative.   Objective:  BP 134/86   Pulse 87   Ht  (1.6 m)   Wt 150 lb (68 kg)   LMP 06/04/2016   BMI 26.57 kg/m  CONSTITUTIONAL: Well-developed, well-nourished female in no acute distress.  HENT:  Normocephalic, atraumatic, External right and left ear normal. Oropharynx is clear and moist EYES: Conjunctivae and EOM are normal. Pupils are equal, round, and reactive to light. No scleral icterus.  NECK: Normal range of motion, supple, no masses.  Normal thyroid.  SKIN: Skin is warm and dry. No rash noted. Not  diaphoretic. No erythema. No pallor. NEUROLOGIC: Alert and oriented to person, place, and time. Normal reflexes, muscle tone coordination. No cranial nerve deficit noted. PSYCHIATRIC: Normal mood and affect. Normal behavior. Normal judgment and thought content. CARDIOVASCULAR: Normal heart rate noted, regular rhythm RESPIRATORY: Clear to auscultation bilaterally. Effort and breath sounds normal, no problems with respiration noted. BREASTS: Symmetric in size. No masses, skin changes, nipple drainage, or lymphadenopathy. ABDOMEN: Soft, normal bowel sounds, no distention noted.  No tenderness, rebound or guarding.  PELVIC: Normal appearing external genitalia; normal appearing vaginal mucosa and cervix.  No  abnormal discharge noted.  Pap smear obtained.  Normal uterine size, no other palpable masses, no uterine or adnexal tenderness. MUSCULOSKELETAL: Normal range of motion. No tenderness.  No cyanosis, clubbing, or edema.  2+ distal pulses.   Assessment:  Annual gynecologic examination with pap smear   Plan:  Will follow up results of pap smear and manage accordingly. Mammogram scheduled Routine preventative health maintenance measures emphasized. Please refer to After Visit Summary for other counseling recommendations.    Jaynie Collins, MD, FACOG Attending Obstetrician & Gynecologist, [facility] and [facility]

## 2016-06-19 NOTE — Progress Notes (Signed)
Last pap approx 75yrs ago Last MM - Never had one Declines flu vaccine today

## 2016-06-21 LAB — CYTOLOGY - PAP
Diagnosis: NEGATIVE
HPV: NOT DETECTED

## 2016-08-06 ENCOUNTER — Ambulatory Visit
Admission: RE | Admit: 2016-08-06 | Discharge: 2016-08-06 | Disposition: A | Discharge status: Home / Self Care | Payer: BC Managed Care – PPO | Source: Ambulatory Visit | Attending: Obstetrics & Gynecology | Admitting: Obstetrics & Gynecology

## 2016-08-06 DIAGNOSIS — Z1231 Encounter for screening mammogram for malignant neoplasm of breast: Secondary | ICD-10-CM

## 2016-08-07 ENCOUNTER — Other Ambulatory Visit: Payer: Self-pay | Admitting: Obstetrics & Gynecology

## 2016-08-07 DIAGNOSIS — R928 Other abnormal and inconclusive findings on diagnostic imaging of breast: Secondary | ICD-10-CM

## 2016-08-13 ENCOUNTER — Other Ambulatory Visit: Payer: Self-pay | Admitting: Obstetrics & Gynecology

## 2016-08-13 ENCOUNTER — Ambulatory Visit
Admission: RE | Admit: 2016-08-13 | Discharge: 2016-08-13 | Disposition: A | Discharge status: Home / Self Care | Payer: BC Managed Care – PPO | Source: Ambulatory Visit | Attending: Obstetrics & Gynecology | Admitting: Obstetrics & Gynecology

## 2016-08-13 DIAGNOSIS — R928 Other abnormal and inconclusive findings on diagnostic imaging of breast: Secondary | ICD-10-CM

## 2016-08-13 DIAGNOSIS — N63 Unspecified lump in unspecified breast: Secondary | ICD-10-CM

## 2016-09-26 ENCOUNTER — Ambulatory Visit (INDEPENDENT_AMBULATORY_CARE_PROVIDER_SITE_OTHER): Payer: BC Managed Care – PPO | Admitting: Family

## 2016-09-26 ENCOUNTER — Encounter: Payer: Self-pay | Admitting: Family

## 2016-09-26 VITALS — BP 108/66 | HR 74 | Temp 98.1°F | Ht 63.0 in | Wt 133.2 lb

## 2016-09-26 DIAGNOSIS — G43009 Migraine without aura, not intractable, without status migrainosus: Secondary | ICD-10-CM | POA: Diagnosis not present

## 2016-09-26 DIAGNOSIS — I10 Essential (primary) hypertension: Secondary | ICD-10-CM | POA: Diagnosis not present

## 2016-09-26 MED ORDER — RIZATRIPTAN BENZOATE 10 MG PO TABS: ORAL_TABLET | ORAL | 5 refills | Status: DC

## 2016-09-26 MED ORDER — METOPROLOL SUCCINATE ER 50 MG PO TB24: 50.0000 mg | ORAL_TABLET | Freq: Two times a day (BID) | ORAL | 3 refills | Status: DC

## 2016-09-26 MED ORDER — HYDROCHLOROTHIAZIDE 25 MG PO TABS: 25.0000 mg | ORAL_TABLET | Freq: Every day | ORAL | 3 refills | Status: DC

## 2016-09-26 NOTE — Patient Instructions (Addendum)
BP goal < 130/80. Ensure not TOO low since we increased your Toprol.  Let me know if headaches don't improve.    Recurrent Migraine Headache Migraines are a type of headache, and they are usually stronger and more sudden than normal headaches (tension headaches). Migraines are characterized by an intense pulsing, throbbing pain that is usually only present on one side of the head. Sometimes, migraine headaches can cause nausea, vomiting, sensitivity to light and sound, and vision changes. Recurrent migraines keep coming back (recurring). A migraine can last from 4 hours up to 3 days. What are the causes? The exact cause of this condition is not known. However, a migraine may be caused when nerves in the brain become irritated and release chemicals that cause inflammation of blood vessels. This inflammation causes pain. Certain things may also trigger migraines, such as:  A disruption in your regular eating and sleeping schedule.  Smoking.  Stress.  Menstruation.  Certain foods and drinks, such as: ? Aged cheese. ? Chocolate. ? Alcohol. ? Caffeine. ? Foods or drinks that contain nitrates, glutamate, aspartame, MSG, or tyramine.  Lack of sleep.  Hunger.  Physical exertion.  Fatigue.  High altitude.  Weather changes.  Medicines, such as: ? Nitroglycerin, which is used to treat chest pain. ? Birth control pills. ? Estrogen. ? Some blood pressure medicines.  What are the signs or symptoms? Symptoms of this condition vary for each person and may include:  Pain that is usually only present on one side of the head. In some cases, the pain may be on both sides of the head or around the head or neck.  Pulsating or throbbing pain.  Severe pain that prevents daily activities.  Pain that is aggravated by any physical activity.  Nausea, vomiting, or both.  Dizziness.  Pain with exposure to bright lights, loud noises, or activity.  General sensitivity to bright lights,  loud noises, or smells.  Before you get a migraine, you may get warning signs that a migraine is coming (aura). An aura may include:  Seeing flashing lights.  Seeing bright spots, halos, or zigzag lines.  Having tunnel vision or blurred vision.  Having numbness or a tingling feeling.  Having trouble talking.  Having muscle weakness.  Smelling a certain odor.  How is this diagnosed? This condition is often diagnosed based on:  Your symptoms and medical history.  A physical exam.  You may also have tests, including:  A CT scan or MRI of your brain. These imaging tests cannot diagnose migraines, but they can help to rule out other causes of headaches.  Blood tests.  How is this treated? This condition is treated with:  Medicines. These are used for: ? Lessening pain and nausea. ? Preventing recurrent migraines.  Lifestyle changes, such as changes to your diet or sleeping patterns.  Behavior therapy, such as relaxation training or biofeedback. Biofeedback is a treatment that involves teaching you to relax and use your brain to lower your heart rate and control your breathing.  Follow these instructions at home: Medicines  Take over-the-counter and prescription medicines only as told by your health care provider.  Do not drive or use heavy machinery while taking prescription pain medicine. Lifestyle  Do not use any products that contain nicotine or tobacco, such as cigarettes and e-cigarettes. If you need help quitting, ask your health care provider.  Limit alcohol intake to no more than 1 drink a day for nonpregnant women and 2 drinks a day for men.  One drink equals 12 oz of beer, 5 oz of wine, or 1 oz of hard liquor.  Get 7-9 hours of sleep each night, or the amount of sleep recommended by your health care provider.  Limit your stress. Talk with your health care provider if you need help with stress management.  Maintain a healthy weight. If you need help  losing weight, ask your health care provider.  Exercise regularly. Aim for 150 minutes of moderate-intensity exercise (walking, biking, yoga) or 75 minutes of vigorous exercise (running, circuit training, swimming) each week. General instructions  Keep a journal to find out what triggers your migraine headaches so you can avoid these triggers. For example, write down: ? What you eat and drink. ? How much sleep you get. ? Any change to your diet or medicines.  Lie down in a dark, quiet room when you have a migraine.  Try placing a cool towel over your head when you have a migraine.  Keep lights dim, if bright lights bother you and make your migraines worse.  Keep all follow-up visits as told by your health care provider. This is important. Contact a health care provider if:  Your pain does not improve, even with medicine.  Your migraines continue to return, even with medicine.  You have a fever.  You have weight loss. Get help right away if:  Your migraine becomes severe and medicine does not help.  You have a stiff neck.  You have a loss of vision.  You have muscle weakness or loss of muscle control.  You start losing your balance or have trouble walking.  You feel faint or you pass out.  You develop new, severe symptoms.  You start having abrupt severe headaches that last for a second or less, like a thunderclap. Summary  Migraine headaches are usually stronger and more sudden than normal headaches (tension headaches). Migraines are characterized by an intense pulsing, throbbing pain that is usually only present on one side of the head.  The exact cause of this condition is not known. However, a migraine may be caused when nerves in the brain become irritated and release chemicals that cause inflammation of blood vessels.  Certain things may trigger migraines, such as changes to diet or sleeping patterns, smoking, certain foods, alcohol, stress, and certain  medicines.  Sometimes, migraine headaches can cause nausea, vomiting, sensitivity to light and sound, and vision changes.  Migraines are often diagnosed based on your symptoms, medical history, and a physical exam. This information is not intended to replace advice given to you by your health care provider. Make sure you discuss any questions you have with your health care provider. Document Released: 10/31/2000 Document Revised: 11/18/2015 Document Reviewed: 11/18/2015 Elsevier Interactive Patient Education  [employer]2018 [employer] Inc.

## 2016-09-26 NOTE — Assessment & Plan Note (Signed)
HA quality and presentation unchanged from prior. Migraine with Aura. Due to frequency of HA and patient desire, will start ppx. Since already on Toprol XL, will increase from once daily to BID 50mg  to see if helps. Return precautions given.

## 2016-09-26 NOTE — Progress Notes (Signed)
Pre visit review using our clinic review tool, if applicable. No additional management support is needed unless otherwise documented below in the visit note. 

## 2016-09-26 NOTE — Assessment & Plan Note (Signed)
Controlled. Continue current regimen. 

## 2016-09-26 NOTE — Progress Notes (Signed)
Subjective:    Patient ID: Heather [name], female    DOB: [birth year], 41 y.o.   MRN: [id number]  CC: Heather [name] is a 41 y.o. female who presents today for follow up.   HPI: HTN- compliant with medication. Denies exertional chest pain or pressure, numbness or tingling radiating to left arm or jaw, palpitations, dizziness, frequent headaches, changes in vision, or shortness of breath.    Migraines with aura- Unchanged. Mother recently started a preventative medication and she would like to do the same. Endorses flashing lights as aura.. Maxalt works however HA tends to recur. [name] 'feels the same as in past.' No numbness, facial dropping.  triggered by menstrual cycle, almost the entire cycle, and bariatric pressure changes which occur throughout month.         HISTORY:  Past Medical History:  Diagnosis Date  . Hyperlipidemia   . Hypertension   . Nail fungus 06.14.2014   Past Surgical History:  Procedure Laterality Date  . VAGINAL DELIVERY     Family History  Problem Relation Age of Onset  . Hypertension Mother   . Heart disease Father   . Heart disease Maternal Grandmother   . Heart disease Maternal Grandfather   . CVA Paternal Grandmother     Allergies: Gluten meal No current outpatient prescriptions on file prior to visit.   No current facility-administered medications on file prior to visit.     Social History  Substance Use Topics  . Smoking status: Never Smoker  . Smokeless tobacco: Never Used  . Alcohol use No    Review of Systems  Constitutional: Negative for chills and fever.  Respiratory: Negative for cough.   Cardiovascular: Negative for chest pain and palpitations.  Gastrointestinal: Negative for nausea and vomiting.  Neurological: Positive for headaches. Negative for weakness and numbness.  Psychiatric/Behavioral: Negative for confusion.      Objective:    BP 108/66   Pulse 74   Temp 98.1 F (36.7 C) (Oral)   Ht 5\' 3"  (1.6 m)    Wt 133 lb 3.2 oz (60.4 kg)   SpO2 97%   BMI 23.60 kg/m  BP Readings from Last 3 Encounters:  09/26/16 108/66  06/19/16 134/86  03/12/16 126/86   Wt Readings from Last 3 Encounters:  09/26/16 133 lb 3.2 oz (60.4 kg)  06/19/16 150 lb (68 kg)  03/12/16 150 lb 9.6 oz (68.3 kg)    Physical Exam  Constitutional: She appears well-developed and well-nourished.  Eyes: Conjunctivae are normal.  Cardiovascular: Normal rate, regular rhythm, normal heart sounds and normal pulses.   Pulmonary/Chest: Effort normal and breath sounds normal. She has no wheezes. She has no rhonchi. She has no rales.  Neurological: She is alert.  Skin: Skin is warm and dry.  Psychiatric: She has a normal mood and affect. Her speech is normal and behavior is normal. Thought content normal.  Vitals reviewed.      Assessment & Plan:   Problem List Items Addressed This Visit      Cardiovascular and Mediastinum   Hypertension    Controlled. Continue current regimen      Relevant Medications   hydrochlorothiazide (HYDRODIURIL) 25 MG tablet   metoprolol succinate (TOPROL-XL) 50 MG 24 hr tablet   Migraine - Primary    HA quality and presentation unchanged from prior. Migraine with Aura. Due to frequency of HA and patient desire, will start ppx. Since already on Toprol XL, will increase from once daily to BID 50mg  to  see if helps. Return precautions given.       Relevant Medications   hydrochlorothiazide (HYDRODIURIL) 25 MG tablet   metoprolol succinate (TOPROL-XL) 50 MG 24 hr tablet   rizatriptan (MAXALT) 10 MG tablet       I have changed [name]'s metoprolol succinate. I am also having her maintain her hydrochlorothiazide and rizatriptan.   Meds ordered this encounter  Medications  . hydrochlorothiazide (HYDRODIURIL) 25 MG tablet    Sig: Take 1 tablet (25 mg total) by mouth daily.    Dispense:  90 tablet    Refill:  3  . metoprolol succinate (TOPROL-XL) 50 MG 24 hr tablet    Sig: Take 1  tablet (50 mg total) by mouth 2 (two) times daily. Take with or immediately following a meal.    Dispense:  240 tablet    Refill:  3  . rizatriptan (MAXALT) 10 MG tablet    Sig: TAKE 1 TABLET BY MOUTH AS NEEDED FOR MIGRAINE. MAY REPEAT IN 2 HOURS IF NEEDED    Dispense:  10 tablet    Refill:  5    Return precautions given.   Risks, benefits, and alternatives of the medications and treatment plan prescribed today were discussed, and patient expressed understanding.   Education regarding symptom management and diagnosis given to patient on AVS.  Continue to follow with Allegra GranaArnett, Deana Krock G, FNP for routine health maintenance.   Heather NoseAmanda G Filsaime and I agreed with plan.   Rennie PlowmanMargaret Ricky Gallery, FNP

## 2016-09-27 ENCOUNTER — Telehealth: Payer: Self-pay

## 2016-09-27 DIAGNOSIS — I10 Essential (primary) hypertension: Secondary | ICD-10-CM

## 2016-09-27 DIAGNOSIS — G43009 Migraine without aura, not intractable, without status migrainosus: Secondary | ICD-10-CM

## 2016-09-27 NOTE — Telephone Encounter (Signed)
Insurance doesn't cover Metoprolol 50 mg BID. Pharmacy is requesting 100 mg QD, Insurance will cover. Therapy change is required if meets your plan of care.  LOV: 09/26/16

## 2016-10-02 MED ORDER — METOPROLOL SUCCINATE ER 50 MG PO TB24: 100.0000 mg | ORAL_TABLET | Freq: Every day | ORAL | 1 refills | Status: DC

## 2016-10-02 NOTE — Addendum Note (Signed)
Addended by: Allegra GranaARNETT, MARGARET G on: 10/02/2016 07:14 AM   Modules accepted: Orders

## 2016-10-02 NOTE — Telephone Encounter (Signed)
Patient has been informed.

## 2016-10-02 NOTE — Telephone Encounter (Signed)
Changed to 100mg  QD  Please call pt and let her know as this is different from care plan

## 2016-10-04 ENCOUNTER — Encounter: Payer: Self-pay | Admitting: Family

## 2017-02-21 ENCOUNTER — Other Ambulatory Visit: Payer: Self-pay | Admitting: Obstetrics & Gynecology

## 2017-02-21 ENCOUNTER — Ambulatory Visit
Admission: RE | Admit: 2017-02-21 | Discharge: 2017-02-21 | Disposition: A | Discharge status: Home / Self Care | Payer: BC Managed Care – PPO | Source: Ambulatory Visit | Attending: Obstetrics & Gynecology | Admitting: Obstetrics & Gynecology

## 2017-02-21 DIAGNOSIS — Z1231 Encounter for screening mammogram for malignant neoplasm of breast: Secondary | ICD-10-CM

## 2017-02-21 DIAGNOSIS — N63 Unspecified lump in unspecified breast: Secondary | ICD-10-CM

## 2017-03-04 ENCOUNTER — Encounter: Payer: Self-pay | Admitting: Family

## 2017-03-05 ENCOUNTER — Other Ambulatory Visit: Payer: Self-pay | Admitting: Internal Medicine

## 2017-03-05 MED ORDER — OSELTAMIVIR PHOSPHATE 75 MG PO CAPS: 75.0000 mg | ORAL_CAPSULE | Freq: Every day | ORAL | 0 refills | Status: DC

## 2017-04-01 ENCOUNTER — Encounter: Payer: Self-pay | Admitting: Family

## 2017-04-01 DIAGNOSIS — I10 Essential (primary) hypertension: Secondary | ICD-10-CM

## 2017-04-01 DIAGNOSIS — G43009 Migraine without aura, not intractable, without status migrainosus: Secondary | ICD-10-CM

## 2017-04-02 NOTE — Telephone Encounter (Signed)
Pt is calling to check on the status of this. States it has been 3 days without BP medication (metoprolol). Please advise.

## 2017-04-03 MED ORDER — METOPROLOL SUCCINATE ER 50 MG PO TB24: 100.0000 mg | ORAL_TABLET | Freq: Every day | ORAL | 0 refills | Status: DC

## 2017-04-03 MED ORDER — METOPROLOL SUCCINATE ER 100 MG PO TB24: 100.0000 mg | ORAL_TABLET | Freq: Every day | ORAL | 1 refills | Status: DC

## 2017-04-03 NOTE — Telephone Encounter (Signed)
Please call patient back , she is very concerned because she has been without her BP meds. Call back [phone]

## 2017-04-03 NOTE — Telephone Encounter (Signed)
I called and spoke with [facility] and they stated in regards to script for metoprolol sucinate 50 mg  (toprolol XL) two tablets daily . Insurance prefers you change script to metoprolol sucinate 100 mg 1 tablet daily

## 2017-05-06 ENCOUNTER — Encounter: Payer: Self-pay | Admitting: Radiology

## 2017-06-06 ENCOUNTER — Telehealth: Payer: BC Managed Care – PPO | Admitting: Family

## 2017-06-06 DIAGNOSIS — B9689 Other specified bacterial agents as the cause of diseases classified elsewhere: Secondary | ICD-10-CM

## 2017-06-06 DIAGNOSIS — J329 Chronic sinusitis, unspecified: Secondary | ICD-10-CM

## 2017-06-06 MED ORDER — BENZONATATE 100 MG PO CAPS: 100.0000 mg | ORAL_CAPSULE | Freq: Three times a day (TID) | ORAL | 0 refills | Status: DC | PRN

## 2017-06-06 MED ORDER — AMOXICILLIN-POT CLAVULANATE 875-125 MG PO TABS: 1.0000 | ORAL_TABLET | Freq: Two times a day (BID) | ORAL | 0 refills | Status: AC

## 2017-06-06 NOTE — Progress Notes (Signed)
Thank you for the details you included in the comment boxes. Those details are very helpful in determining the best course of treatment for you and help us to provide the best care.  We are sorry that you are not feeling well.  Here is how we plan to help!  Based on what you have shared with me it looks like you have sinusitis.  Sinusitis is inflammation and infection in the sinus cavities of the head.  Based on your presentation I believe you most likely have Acute Bacterial Sinusitis.  This is an infection caused by bacteria and is treated with antibiotics. I have prescribed Augmentin 875mg /125mg  one tablet twice daily with food, for 7 days. You may use an oral decongestant such as Mucinex D or if you have glaucoma or high blood pressure use plain Mucinex. Saline nasal spray help and can safely be used as often as needed for congestion.  If you develop worsening sinus pain, fever or notice severe headache and vision changes, or if symptoms are not better after completion of antibiotic, please schedule an appointment with a health care provider.    I have also sent Tessalon Perles 100mg , take 1 or 2 every 8 hours for cough.   Sinus infections are not as easily transmitted as other respiratory infection, however we still recommend that you avoid close contact with loved ones, especially the very young and elderly.  Remember to wash your hands thoroughly throughout the day as this is the number one way to prevent the spread of infection!  Home Care:  Only take medications as instructed by your medical team.  Complete the entire course of an antibiotic.  Do not take these medications with alcohol.  A steam or ultrasonic humidifier can help congestion.  You can place a towel over your head and breathe in the steam from hot water coming from a faucet.  Avoid close contacts especially the very young and the elderly.  Cover your mouth when you cough or sneeze.  Always remember to wash your  hands.  Get Help Right Away If:  You develop worsening fever or sinus pain.  You develop a severe head ache or visual changes.  Your symptoms persist after you have completed your treatment plan.  Make sure you  Understand these instructions.  Will watch your condition.  Will get help right away if you are not doing well or get worse.  Your e-visit answers were reviewed by a board certified advanced clinical practitioner to complete your personal care plan.  Depending on the condition, your plan could have included both over the counter or prescription medications.  If there is a problem please reply  once you have received a response from your provider.  Your safety is important to us.  If you have drug allergies check your prescription carefully.    You can use MyChart to ask questions about today's visit, request a non-urgent call back, or ask for a work or school excuse for 24 hours related to this e-Visit. If it has been greater than 24 hours you will need to follow up with your provider, or enter a new e-Visit to address those concerns.  You will get an e-mail in the next two days asking about your experience.  I hope that your e-visit has been valuable and will speed your recovery. Thank you for using e-visits.

## 2017-08-20 ENCOUNTER — Other Ambulatory Visit: Payer: Self-pay | Admitting: Obstetrics & Gynecology

## 2017-08-20 DIAGNOSIS — N63 Unspecified lump in unspecified breast: Secondary | ICD-10-CM

## 2017-08-23 ENCOUNTER — Ambulatory Visit
Admission: RE | Admit: 2017-08-23 | Discharge: 2017-08-23 | Disposition: A | Discharge status: Home / Self Care | Payer: BC Managed Care – PPO | Source: Ambulatory Visit | Attending: Obstetrics & Gynecology | Admitting: Obstetrics & Gynecology

## 2017-08-23 DIAGNOSIS — N63 Unspecified lump in unspecified breast: Secondary | ICD-10-CM

## 2017-09-27 ENCOUNTER — Encounter: Payer: Self-pay | Admitting: Family

## 2017-09-27 ENCOUNTER — Ambulatory Visit: Payer: BC Managed Care – PPO | Admitting: Family

## 2017-09-27 VITALS — BP 114/82 | HR 78 | Temp 98.7°F | Resp 14 | Wt 144.0 lb

## 2017-09-27 DIAGNOSIS — J309 Allergic rhinitis, unspecified: Secondary | ICD-10-CM | POA: Diagnosis not present

## 2017-09-27 DIAGNOSIS — G43009 Migraine without aura, not intractable, without status migrainosus: Secondary | ICD-10-CM | POA: Diagnosis not present

## 2017-09-27 DIAGNOSIS — I10 Essential (primary) hypertension: Secondary | ICD-10-CM | POA: Diagnosis not present

## 2017-09-27 DIAGNOSIS — F4323 Adjustment disorder with mixed anxiety and depressed mood: Secondary | ICD-10-CM

## 2017-09-27 MED ORDER — SERTRALINE HCL 50 MG PO TABS: 50.0000 mg | ORAL_TABLET | Freq: Every day | ORAL | 3 refills | Status: DC

## 2017-09-27 MED ORDER — FLUTICASONE PROPIONATE 50 MCG/ACT NA SUSP: 2.0000 | Freq: Every day | NASAL | 3 refills | Status: DC

## 2017-09-27 NOTE — Patient Instructions (Signed)
Trial zoloft at bedtime  Start flonase and also use nasal saline spray  Follow up 6 weeks

## 2017-09-27 NOTE — Assessment & Plan Note (Signed)
Trial of Flonase.  Patient let me know how she is doing.

## 2017-09-27 NOTE — Assessment & Plan Note (Signed)
Well-controlled.  Continue current regimen. 

## 2017-09-27 NOTE — Progress Notes (Signed)
Subjective:    Patient ID: Heather [name], female    DOB: 06/23/1975, 42 y.o.   MRN: [id number]  CC: Heather [name] is a 42 y.o. female who presents today for follow up.   HPI: HA- right sided, unchanged from prior HA. . taking maxalt , with great relief at first. Now [facility], approx 3 days per month - headache will resolve. Thinks related to hormones. Comes right before menses or weather related   HTN- on metoprolol ER 100mg  QD. No CP   Complains of not sleeping for several months. Trouble falling asleep due to mind racing.  Trying tyleonol PM which is not working.   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 Thinks has social anxiety. No depression. No si/hi.   Clear nasal discharge year round for years. On right side. Not bloody. Unchanged. No sinus pain, fever, sore throat, cough. Worse in school year. Tried zyzal, claritin, zyrtec.Doesnt think snores.          HISTORY:  Past Medical History:  Diagnosis Date  . Hyperlipidemia   . Hypertension   . Nail fungus 06.14.2014   Past Surgical History:  Procedure Laterality Date  . VAGINAL DELIVERY     Family History  Problem Relation Age of Onset  . Hypertension Mother   . Heart disease Father   . Heart disease Maternal Grandmother   . Heart disease Maternal Grandfather   . CVA Paternal Grandmother   . Breast cancer Neg Hx     Allergies: Gluten meal Current Outpatient Medications on File Prior to Visit  Medication Sig Dispense Refill  . diphenhydramine-acetaminophen (TYLENOL PM) 25-500 MG TABS tablet Take 1 tablet by mouth at bedtime as needed.    . hydrochlorothiazide (HYDRODIURIL) 25 MG tablet Take 1 tablet (25 mg total) by mouth daily. 90 tablet 3  . metoprolol succinate (TOPROL-XL) 100 MG 24 hr tablet Take 1 tablet (100 mg total) by mouth daily. Take with or immediately following a meal. 90 tablet 1  . rizatriptan (MAXALT) 10 MG tablet  TAKE 1 TABLET BY MOUTH AS NEEDED FOR MIGRAINE. MAY REPEAT IN 2 HOURS IF NEEDED 10 tablet 5   No current facility-administered medications on file prior to visit.     Social History   Tobacco Use  . Smoking status: Never Smoker  . Smokeless tobacco: Never Used  Substance Use Topics  . Alcohol use: No  . Drug use: No    Review of Systems  Constitutional: Negative for chills and fever.  HENT: Positive for postnasal drip and rhinorrhea. Negative for sinus pain, sore throat and trouble swallowing.   Respiratory: Negative for cough and shortness of breath.   Cardiovascular: Negative for chest pain and palpitations.  Gastrointestinal: Negative for nausea and vomiting.  Neurological: Positive for headaches. Negative for dizziness.  Psychiatric/Behavioral: Positive for sleep disturbance. Negative for suicidal ideas. The patient is nervous/anxious.       Objective:    BP 114/82 (BP Location: Left Arm, Patient Position: Sitting, Cuff Size: Normal)   Pulse 78   Temp 98.7 F (37.1 C) (Oral)   Resp 14   Wt 144 lb (65.3 kg)   SpO2 98%   BMI 25.51 kg/m  BP Readings from Last 3 Encounters:  09/27/17 114/82  09/26/16 108/66  06/19/16 134/86   Wt Readings from Last 3 Encounters:  09/27/17 144 lb (65.3 kg)  09/26/16 133 lb 3.2 oz (60.4 kg)  06/19/16  150 lb (68 kg)    Physical Exam  Constitutional: She appears well-developed and well-nourished.  HENT:  Head: Normocephalic and atraumatic.  Right Ear: Hearing, tympanic membrane, external ear and ear canal normal. No swelling or tenderness. Tympanic membrane is not erythematous and not bulging. No middle ear effusion.  Left Ear: Tympanic membrane, external ear and ear canal normal. No swelling or tenderness. Tympanic membrane is not erythematous and not bulging.  No middle ear effusion.  Nose: Nose normal. No rhinorrhea. Right sinus exhibits no maxillary sinus tenderness and no frontal sinus tenderness. Left sinus exhibits no maxillary  sinus tenderness and no frontal sinus tenderness.  Mouth/Throat: Uvula is midline, oropharynx is clear and moist and mucous membranes are normal. No posterior oropharyngeal edema or posterior oropharyngeal erythema.  Eyes: Pupils are equal, round, and reactive to light. Conjunctivae, EOM and lids are normal. Lids are everted and swept, no foreign bodies found.  Normal fundus bilaterally   Cardiovascular: Normal rate, regular rhythm, normal heart sounds and normal pulses.  Pulmonary/Chest: Effort normal and breath sounds normal. She has no wheezes. She has no rhonchi. She has no rales.  Lymphadenopathy:       Head (right side): No submental, no submandibular, no tonsillar, no preauricular, no posterior auricular and no occipital adenopathy present.       Head (left side): No submental, no submandibular, no tonsillar, no preauricular, no posterior auricular and no occipital adenopathy present.    She has no cervical adenopathy.       Right cervical: No superficial cervical, no deep cervical and no posterior cervical adenopathy present.      Left cervical: No superficial cervical, no deep cervical and no posterior cervical adenopathy present.  Neurological: She is alert. She has normal strength. No cranial nerve deficit or sensory deficit. She displays a negative Romberg sign.  Reflex Scores:      Bicep reflexes are 2+ on the right side and 2+ on the left side.      Patellar reflexes are 2+ on the right side and 2+ on the left side. Grip equal and strong bilateral upper extremities. Gait strong and steady. Able to perform  finger-to-nose without difficulty.   Skin: Skin is warm and dry.  Psychiatric: She has a normal mood and affect. Her speech is normal and behavior is normal. Thought content normal.  Vitals reviewed.      Assessment & Plan:   Problem List Items Addressed This Visit      Cardiovascular and Mediastinum   Hypertension    Well-controlled.  Continue current regimen.       Migraine    Frequency and presentation at baseline.  Reassuring normal neurologic exam.  Discussed with patient perhaps sleep, allergies are contributory.  Jointly agreed that we would pursue treatment of these things.  She will maintain close follow-up however we did discuss hormonal treatment and possible consult with GYN for hormone placement therapy.  We will discuss this at follow-up.      Relevant Medications   sertraline (ZOLOFT) 50 MG tablet     Respiratory   Allergic rhinitis    Trial of Flonase.  Patient let me know how she is doing.      Relevant Medications   fluticasone (FLONASE) 50 MCG/ACT nasal spray     Other   Adjustment disorder with mixed anxiety and depressed mood - Primary    Trial of Zoloft.  Jointly discussed I hope this will help with the anxiety and trouble falling asleep.  Follow-up in 6 weeks.      Relevant Medications   sertraline (ZOLOFT) 50 MG tablet       I have discontinued Darol Destine. Makris's oseltamivir and benzonatate. I am also having her start on fluticasone and sertraline. Additionally, I am having her maintain her hydrochlorothiazide, rizatriptan, metoprolol succinate, and diphenhydramine-acetaminophen.   Meds ordered this encounter  Medications  . fluticasone (FLONASE) 50 MCG/ACT nasal spray    Sig: Place 2 sprays into both nostrils daily.    Dispense:  16 g    Refill:  3    Order Specific Question:   Supervising Provider    Answer:   Darrick Huntsman, TERESA L [[id number]]  . sertraline (ZOLOFT) 50 MG tablet    Sig: Take 1 tablet (50 mg total) by mouth at bedtime.    Dispense:  90 tablet    Refill:  3    Order Specific Question:   Supervising Provider    Answer:   Sherlene Shams [[id number]]    Return precautions given.   Risks, benefits, and alternatives of the medications and treatment plan prescribed today were discussed, and patient expressed understanding.   Education regarding symptom management and diagnosis given to patient on  AVS.  Continue to follow with Allegra Grana, FNP for routine health maintenance.   Heather Nose and I agreed with plan.   Rennie Plowman, FNP

## 2017-09-27 NOTE — Assessment & Plan Note (Signed)
Trial of Zoloft.  Jointly discussed I hope this will help with the anxiety and trouble falling asleep.  Follow-up in 6 weeks.

## 2017-09-27 NOTE — Assessment & Plan Note (Addendum)
Frequency and presentation at baseline.  Reassuring normal neurologic exam.  Discussed with patient perhaps sleep, allergies are contributory.  Jointly agreed that we would pursue treatment of these things.  She will maintain close follow-up however we did discuss hormonal treatment and possible consult with GYN for hormone placement therapy.  We will discuss this at follow-up.

## 2017-09-30 ENCOUNTER — Other Ambulatory Visit: Payer: Self-pay | Admitting: Family

## 2017-09-30 DIAGNOSIS — G43009 Migraine without aura, not intractable, without status migrainosus: Secondary | ICD-10-CM

## 2017-09-30 DIAGNOSIS — I10 Essential (primary) hypertension: Secondary | ICD-10-CM

## 2017-09-30 MED ORDER — RIZATRIPTAN BENZOATE 10 MG PO TABS: ORAL_TABLET | ORAL | 5 refills | Status: DC

## 2017-09-30 MED ORDER — HYDROCHLOROTHIAZIDE 25 MG PO TABS: 25.0000 mg | ORAL_TABLET | Freq: Every day | ORAL | 1 refills | Status: DC

## 2017-09-30 MED ORDER — METOPROLOL SUCCINATE ER 100 MG PO TB24: 100.0000 mg | ORAL_TABLET | Freq: Every day | ORAL | 1 refills | Status: DC

## 2018-01-27 ENCOUNTER — Encounter: Payer: Self-pay | Admitting: Family

## 2018-01-31 ENCOUNTER — Other Ambulatory Visit: Payer: Self-pay | Admitting: Family

## 2018-01-31 DIAGNOSIS — M25569 Pain in unspecified knee: Secondary | ICD-10-CM

## 2018-01-31 NOTE — Progress Notes (Signed)
Ref

## 2018-02-03 ENCOUNTER — Other Ambulatory Visit: Payer: Self-pay | Admitting: Family

## 2018-02-03 DIAGNOSIS — J309 Allergic rhinitis, unspecified: Secondary | ICD-10-CM

## 2018-03-04 ENCOUNTER — Encounter: Payer: Self-pay | Admitting: Allergy & Immunology

## 2018-03-04 ENCOUNTER — Ambulatory Visit: Payer: BC Managed Care – PPO | Admitting: Allergy & Immunology

## 2018-03-04 VITALS — BP 116/78 | HR 72 | Temp 98.9°F | Resp 16 | Ht 69.0 in | Wt 144.2 lb

## 2018-03-04 DIAGNOSIS — J310 Chronic rhinitis: Secondary | ICD-10-CM | POA: Insufficient documentation

## 2018-03-04 DIAGNOSIS — T781XXD Other adverse food reactions, not elsewhere classified, subsequent encounter: Secondary | ICD-10-CM | POA: Diagnosis not present

## 2018-03-04 MED ORDER — IPRATROPIUM BROMIDE 0.06 % NA SOLN: 2.0000 | Freq: Four times a day (QID) | NASAL | 5 refills | Status: DC

## 2018-03-04 NOTE — Progress Notes (Signed)
NEW PATIENT  Date of Service/Encounter:  03/04/18  Referring provider: Allegra GranaArnett, Margaret G, FNP   Assessment:   Non-allergic rhinitis   Adverse food reaction (wheat) - with negative testing today, possibly Celiac   Plan/Recommendations:   1. Non-allergic rhinitis - Testing today showed negative to the entire panel. - We did not do intradermal testing since you lacked other symptoms of allergic rhinitis (itchy nose, itchy eyes, sneezing, etc). - We are going to try a new nose spray instead and go from there.  - Start taking: nasal ipraptropium (Atrovent) one spray per nostril up to four times daily as needed for runny nose. - This can be over drying so beware of this.   2. Return in about 4 weeks (around 04/01/2018).  Subjective:   Heather [name] is a 43 y.o. female presenting today for evaluation of  Chief Complaint  Patient presents with  . New Patient (Initial Visit)    runny nose all the time x 2 yrs     Heather Nosemanda G Towry has a history of the following: Patient Active Problem List   Diagnosis Date Noted  . Allergic rhinitis 09/27/2017  . Chronic fatigue 09/07/2014  . Routine general medical examination at a health care facility 08/05/2013  . Adjustment disorder with mixed anxiety and depressed mood 12/15/2012  . Migraine 05/11/2011  . Hypertension 04/06/2011  . Hyperlipidemia 04/06/2011    History obtained from: chart review and patient.  Heather NoseAmanda G Siers was referred by Allegra GranaArnett, Margaret G, FNP.     Heather [name] is a 43 y.o. female presenting for an evaluation of chronic rhinorrhea. She reports that she has a runny nose for two years. She used her a nose spray for a couple of weeks without any improvement. This was fluticasone. She has been on cetirizine, loratadine, and others. She has been on Xyzal which worked a short period of time, but it stopped. It always clear. There is never any sinus pain or fevers. Symptoms are year round. She has had dogs for over  ten years.  She denies postnasal drip and coughing. She has never been put on prednisone for this. She does not get antibiotics for this.   She is allergic to gluten with severe cramps. She was tested for Celiac but it was felt that this might have had a false negative since she was not consuming it. She otherwise tolerates all of the major food allergens without adverse event.   Otherwise, there is no history of other atopic diseases, including asthma, food allergies, drug allergies, stinging insect allergies, eczema or urticaria. There is no significant infectious history. Vaccinations are up to date.    Past Medical History: Patient Active Problem List   Diagnosis Date Noted  . Allergic rhinitis 09/27/2017  . Chronic fatigue 09/07/2014  . Routine general medical examination at a health care facility 08/05/2013  . Adjustment disorder with mixed anxiety and depressed mood 12/15/2012  . Migraine 05/11/2011  . Hypertension 04/06/2011  . Hyperlipidemia 04/06/2011    Medication List:  Allergies as of 03/04/2018      Reactions   Gluten Meal Diarrhea      Medication List       Accurate as of March 04, 2018  7:36 PM. Always use your most recent med list.        diphenhydramine-acetaminophen 25-500 MG Tabs tablet Commonly known as:  TYLENOL PM Take 1 tablet by mouth at bedtime as needed.   fluticasone 50 MCG/ACT nasal spray Commonly known as:  FLONASE Place 2 sprays into both nostrils daily.   hydrochlorothiazide 25 MG tablet Commonly known as:  HYDRODIURIL Take 1 tablet (25 mg total) by mouth daily.   ipratropium 0.06 % nasal spray Commonly known as:  ATROVENT Place 2 sprays into both nostrils 4 (four) times daily.   metoprolol succinate 100 MG 24 hr tablet Commonly known as:  TOPROL-XL Take 1 tablet (100 mg total) by mouth daily. Take with or immediately following a meal.   PENNSAID 2 % Soln Generic drug:  Diclofenac Sodium Place onto the skin.   rizatriptan 10  MG tablet Commonly known as:  MAXALT TAKE 1 TABLET BY MOUTH AS NEEDED FOR MIGRAINE. MAY REPEAT IN 2 HOURS IF NEEDED   sertraline 50 MG tablet Commonly known as:  ZOLOFT Take 1 tablet (50 mg total) by mouth at bedtime.       Birth History: non-contributory  Developmental History: non-contributory.   Past Surgical History: Past Surgical History:  Procedure Laterality Date  . VAGINAL DELIVERY       Family History: Family History  Problem Relation Age of Onset  . Hypertension Mother   . Heart disease Father   . Heart disease Maternal Grandmother   . Heart disease Maternal Grandfather   . CVA Paternal Grandmother   . Breast cancer Neg Hx   . Allergic rhinitis Neg Hx   . Eczema Neg Hx   . Asthma Neg Hx   . Urticaria Neg Hx      Social History: Demira lives at home with her husband, daughter, and one grandson. He is going to be four next week. She has lived here "forever". They live in a house that is 44 year old. There is laminate flooring throughout the home. They have gas heating and central cooling. There are two dogs in the home, one of which sleeps in the bedrooms. There are dust mite coverings on the bed, but not the pillows. She currently works as a Emergency planning/management officer at [employer].    Review of Systems: a 14-point review of systems is pertinent for what is mentioned in HPI.  Otherwise, all other systems were negative. Constitutional: negative other than that listed in the HPI Eyes: negative other than that listed in the HPI Ears, nose, mouth, throat, and face: negative other than that listed in the HPI Respiratory: negative other than that listed in the HPI Cardiovascular: negative other than that listed in the HPI Gastrointestinal: negative other than that listed in the HPI Genitourinary: negative other than that listed in the HPI Integument: negative other than that listed in the HPI Hematologic: negative other than that listed in the  HPI Musculoskeletal: negative other than that listed in the HPI Neurological: negative other than that listed in the HPI Allergy/Immunologic: negative other than that listed in the HPI    Objective:   Blood pressure 116/78, pulse 72, temperature 98.9 F (37.2 C), temperature source Tympanic, resp. rate 16, height 5\' 9"  (1.753 m), weight 144 lb 3.2 oz (65.4 kg). Body mass index is 21.29 kg/m.   Physical Exam:  General: Alert, interactive, in no acute distress. Smiling and pleasant.  Eyes: No conjunctival injection bilaterally, no discharge on the right, no discharge on the left and no Horner-Trantas dots present. PERRL bilaterally. EOMI without pain. No photophobia.  Ears: Right TM pearly gray with normal light reflex, Left TM pearly gray with normal light reflex, Right TM intact without perforation and Left TM intact without perforation.  Nose/Throat: External nose within normal  limits and septum midline. Turbinates edematous with clear discharge. Posterior oropharynx erythematous with cobblestoning in the posterior oropharynx. Tonsils 2+ without exudates.  Tongue without thrush. Neck: Supple without thyromegaly. Trachea midline. Adenopathy: no enlarged lymph nodes appreciated in the anterior cervical, occipital, axillary, epitrochlear, inguinal, or popliteal regions. Lungs: Clear to auscultation without wheezing, rhonchi or rales. No increased work of breathing. CV: Normal S1/S2. No murmurs. Capillary refill <2 seconds.  Abdomen: Nondistended, nontender. No guarding or rebound tenderness. Bowel sounds present in all fields and hypoactive  Skin: Warm and dry, without lesions or rashes. Extremities:  No clubbing, cyanosis or edema. Neuro:   Grossly intact. No focal deficits appreciated. Responsive to questions.  Diagnostic studies:   Allergy Studies:   Airborne Adult Perc - 23-Mar-2018 1450    Allergen Manufacturer  Waynette Buttery    Location  Back    Number of Test  59    Panel 1  Select     1. Control-Buffer 50% Glycerol  Negative    2. Control-Histamine 1 mg/ml  2+    3. Albumin saline  Negative    4. Bahia  Negative    5. French Southern Territ[place]  Negative    6. Johnson  Negative    7. Kentucky Blue  Negative    8. Meadow Fescue  Negative    9. Perennial Rye  Negative    10. Sweet Vernal  Negative    11. Timothy  Negative    12. Cocklebur  Negative    13. Burweed Marshelder  Negative    14. Ragweed, short  Negative    15. Ragweed, Giant  Negative    16. Plantain,  English  Negative    17. Lamb's Quarters  Negative    18. Sheep Sorrell  Negative    19. Rough Pigweed  Negative    20. Marsh Elder, Rough  Negative    21. Mugwort, Common  Negative    22. Ash mix  Negative    23. Birch mix  Negative    24. Beech American  Negative    25. Box, Elder  Negative    26. Cedar, red  Negative    27. Cottonwood, Guinea-Bissau  Negative    28. Elm mix  Negative    29. Hickory mix  Negative    30. Maple mix  Negative    31. Oak, Guinea-Bissau mix  Negative    32. Pecan Pollen  Negative    33. Pine mix  Negative    34. Sycamore Eastern  Negative    35. Walnut, Black Pollen  Negative    36. Alternaria alternata  Negative    37. Cladosporium Herbarum  Negative    38. Aspergillus mix  Negative    39. Penicillium mix  Negative    40. Bipolaris sorokiniana (Helminthosporium)  Negative    41. Drechslera spicifera (Curvularia)  Negative    42. Mucor plumbeus  Negative    43. Fusarium moniliforme  Negative    44. Aureobasidium pullulans (pullulara)  Negative    45. Rhizopus oryzae  Negative    46. Botrytis cinera  Negative    47. Epicoccum nigrum  Negative    48. Phoma betae  Negative    49. Candida Albicans  Negative    50. Trichophyton mentagrophytes  Negative    51. Mite, D Farinae  5,000 AU/ml  Negative    52. Mite, D Pteronyssinus  5,000 AU/ml  Negative    53. Cat Hair 10,000 BAU/ml  Negative  54.  Dog Epithelia  Negative    55. Mixed Feathers  Negative    56. Horse Epithelia  Negative     57. Cockroach, German  Negative    58. Mouse  Negative    59. Tobacco Leaf  Negative     Food Adult Perc - 03/04/18 1600    3. Wheat  Negative        Allergy testing results were read and interpreted by myself, documented by clinical staff.       Malachi BondsJoel Gallagher, MD [facility]

## 2018-03-04 NOTE — Patient Instructions (Addendum)
1. Non-allergic rhinitis - Testing today showed negative to the entire panel. - We did not do intradermal testing since you lacked other symptoms of allergic rhinitis (itchy nose, itchy eyes, sneezing, etc). - We are going to try a new nose spray instead and go from there.  - Start taking: nasal ipraptropium (Atrovent) one spray per nostril up to four times daily as needed for runny nose. - This can be over drying so beware of this.   2. Return in about 4 weeks (around 04/01/2018).   Please inform us of any Emergency Department visits, hospitalizations, or changes in symptoms. Call us before going to the ED for breathing or allergy symptoms since we might be able to fit you in for a sick visit. Feel free to contact us anytime with any questions, problems, or concerns.  It was a pleasure to meet you today!  Websites that have reliable patient information: 1. American Academy of Asthma, Allergy, and Immunology: www.aaaai.org 2. Food Allergy Research and Education (FARE): foodallergy.org 3. Mothers of Asthmatics: http://www.asthmacommunitynetwork.org 4. American College of Allergy, Asthma, and Immunology: MissingWeapons.ca   Make sure you are registered to vote! If you have moved or changed any of your contact information, you will need to get this updated before voting!    Voter ID laws are going into effect for the General Election in November 2020! Be prepared! Check out LandscapingDigest.dk for more details.

## 2018-03-13 ENCOUNTER — Other Ambulatory Visit: Payer: Self-pay | Admitting: Family

## 2018-03-13 DIAGNOSIS — G43009 Migraine without aura, not intractable, without status migrainosus: Secondary | ICD-10-CM

## 2018-03-13 DIAGNOSIS — I10 Essential (primary) hypertension: Secondary | ICD-10-CM

## 2018-04-08 ENCOUNTER — Ambulatory Visit: Payer: BC Managed Care – PPO | Admitting: Allergy & Immunology

## 2018-04-08 ENCOUNTER — Encounter: Payer: Self-pay | Admitting: Allergy & Immunology

## 2018-04-08 VITALS — BP 112/70 | HR 73 | Resp 16 | Ht 63.0 in | Wt 143.4 lb

## 2018-04-08 DIAGNOSIS — J310 Chronic rhinitis: Secondary | ICD-10-CM | POA: Diagnosis not present

## 2018-04-08 DIAGNOSIS — L232 Allergic contact dermatitis due to cosmetics: Secondary | ICD-10-CM | POA: Diagnosis not present

## 2018-04-08 MED ORDER — FLUTICASONE PROPIONATE 93 MCG/ACT NA EXHU: 1.0000 | INHALANT_SUSPENSION | Freq: Two times a day (BID) | NASAL | 11 refills | Status: DC

## 2018-04-08 MED ORDER — CARBINOXAMINE MALEATE 6 MG PO TABS: 1.0000 | ORAL_TABLET | Freq: Three times a day (TID) | ORAL | 1 refills | Status: DC

## 2018-04-08 MED ORDER — CARBINOXAMINE MALEATE 6 MG PO TABS: 1.0000 | ORAL_TABLET | Freq: Three times a day (TID) | ORAL | 5 refills | Status: DC

## 2018-04-08 NOTE — Progress Notes (Signed)
FOLLOW UP  Date of Service/Encounter:  04/08/18   Assessment:   Non-allergic rhinitis   Adverse food reaction (wheat) - with negative testing today, possibly Celiac   Contact dermatitis - likely to a fragrance within the deodorant  Plan/Recommendations:   1. Non-allergic rhinitis - We will start you on Xhance one spray per nostril twice daily. - We will give you a sample and you can call you in 1-2 weeks to let us know how it works.  - You can get a year supply with your insurance. - We will also start RyVent one tablet up to three times daily (this can cause sleepiness).   2. Contact dermatitis - I recommended that she change back to her previous tolerated deodorant.  - She does have some other scents of the samples of deodorants, so I recommended that she try those once her axilla are healed.  - Patch testing could be performed but since she clearly knows her trigger I think it would be less helpful.   3. Return in about 3 months (around 07/07/2018).   Subjective:   Heather [name] is a 43 y.o. female presenting today for follow up of  Chief Complaint  Patient presents with  . Allergic Rhinitis     Heather [name] has a history of the following: Patient Active Problem List   Diagnosis Date Noted  . Non-allergic rhinitis 03/04/2018  . Allergic rhinitis 09/27/2017  . Chronic fatigue 09/07/2014  . Routine general medical examination at a health care facility 08/05/2013  . Adjustment disorder with mixed anxiety and depressed mood 12/15/2012  . Migraine 05/11/2011  . Hypertension 04/06/2011  . Hyperlipidemia 04/06/2011    History obtained from: chart review and patient.  Heather [name] is a 43 y.o. female presenting for a follow up visit.  She was seen in January 2020.  She had testing that was negative to the entire panel.  We did not do intradermal testing since she had no other symptoms of allergic rhinitis aside from the nasal congestion.  We started her on  nasal ipratropium to help with postnasal drip.  Non-Allergic Rhinitis Symptom History: She reports today that the nasal ipratropium did work for a period of time.  For around 2 weeks, she felt like a new person.  However, the nasal drip did get worse over time.  She has now nearly back to where she was before.  She has had no antibiotics since last visit.  She is no longer using an antihistamine, since she has used the majority of them already.  She does report today that she started reacting to a new deodorant she was using.  She is wondering if there is anything in the deodorant that she can react to.  Apparently, her husband works for [employer] and brought him some free samples.  Otherwise, there is no history of other atopic diseases, including asthma, food allergies, drug allergies, stinging insect allergies, eczema or urticaria. There is no significant infectious history. Vaccinations are up to date.   Otherwise, there have been no changes to her past medical history, surgical history, family history, or social history.  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    Review of Systems  Constitutional: Negative.  Negative for fever, malaise/fatigue and weight loss.  HENT: Negative.  Negative for congestion, ear discharge and ear pain.   Eyes: Negative for pain, discharge and redness.  Respiratory: Negative for cough, sputum production, shortness of breath and wheezing.  Cardiovascular: Negative.  Negative for chest pain and palpitations.  Gastrointestinal: Negative for abdominal pain and heartburn.  Skin: Negative.  Negative for itching and rash.  Neurological: Negative for dizziness and headaches.  Endo/Heme/Allergies: Negative for environmental allergies. Does not bruise/bleed easily.       Objective:   Blood pressure 112/70, pulse 73, resp. rate 16, height 5\' 3"  (1.6 m), weight 143 lb 6.4 oz (65 kg), SpO2 97 %. Body mass index is 25.4 kg/m.   Physical  Exam:  Physical Exam  Constitutional: She appears well-developed.  HENT:  Head: Normocephalic and atraumatic.  Right Ear: Tympanic membrane, external ear and ear canal normal.  Left Ear: Tympanic membrane and ear canal normal.  Nose: No mucosal edema, rhinorrhea, nasal deformity or septal deviation. No epistaxis. Right sinus exhibits no maxillary sinus tenderness and no frontal sinus tenderness. Left sinus exhibits no maxillary sinus tenderness and no frontal sinus tenderness.  Mouth/Throat: Uvula is midline and oropharynx is clear and moist. Mucous membranes are not pale and not dry.  Eyes: Pupils are equal, round, and reactive to light. Conjunctivae and EOM are normal. Right eye exhibits discharge. Right eye exhibits no chemosis. Left eye exhibits no chemosis and no discharge. Right conjunctiva is not injected. Left conjunctiva is not injected.  Cardiovascular: Normal rate, regular rhythm and normal heart sounds.  Respiratory: Effort normal and breath sounds normal. No accessory muscle usage. No tachypnea. No respiratory distress. She has no wheezes. She has no rhonchi. She has no rales. She exhibits no tenderness.  Lymphadenopathy:    She has no cervical adenopathy.  Neurological: She is alert.  Skin: No abrasion, no petechiae and no rash noted. Rash is not papular, not vesicular and not urticarial. No erythema. No pallor.  Psychiatric: She has a normal mood and affect.     Diagnostic studies: none      Malachi Bonds, MD  [facility]

## 2018-04-08 NOTE — Patient Instructions (Addendum)
1. Non-allergic rhinitis - We will start you on Xhance one spray per nostril twice daily. - We will give you a sample and you can call you in 1-2 weeks to let us know how it works.  - You can get a year supply with your insurance. - We will also start RyVent one tablet up to three times daily (this can cause sleepiness).   2. Return in about 3 months (around 07/07/2018).   Please inform us of any Emergency Department visits, hospitalizations, or changes in symptoms. Call us before going to the ED for breathing or allergy symptoms since we might be able to fit you in for a sick visit. Feel free to contact us anytime with any questions, problems, or concerns.   It was a pleasure to see you again today!  Websites that have reliable patient information: 1. American Academy of Asthma, Allergy, and Immunology: www.aaaai.org 2. Food Allergy Research and Education (FARE): foodallergy.org 3. Mothers of Asthmatics: http://www.asthmacommunitynetwork.org 4. American College of Allergy, Asthma, and Immunology: MissingWeapons.ca   Make sure you are registered to vote! If you have moved or changed any of your contact information, you will need to get this updated before voting!    Voter ID laws are going into effect for the General Election in November 2020! Be prepared! Check out LandscapingDigest.dk for more details.

## 2018-04-24 ENCOUNTER — Encounter: Payer: Self-pay | Admitting: Allergy & Immunology

## 2018-04-25 MED ORDER — PREDNISONE 10 MG PO TABS: ORAL_TABLET | ORAL | 0 refills | Status: DC

## 2018-05-18 ENCOUNTER — Encounter: Payer: Self-pay | Admitting: Allergy & Immunology

## 2018-05-19 ENCOUNTER — Telehealth: Payer: Self-pay

## 2018-05-19 NOTE — Telephone Encounter (Addendum)
Development worker, international aid.  They were only able to dispense 30 tablets due to patient insurance will only cover 30 tablets every 20 days for the $15 copay.  Insurance will NOT cover any additional amounts.  Patient can contact pharmacy if she want to pay out of pocket for additional amounts.   Called patient with information.

## 2018-05-19 NOTE — Telephone Encounter (Signed)
If she feels that the Ryvent is helping, please send in a script to cover the recommended amount.   Malachi Bonds, MD [facility]

## 2018-05-19 NOTE — Telephone Encounter (Signed)
Hi there! Hope you are staying safe. Quick question..  So, If I'm suppose to take up to 4 Ryvent a day... the pharmacy only sends 30 pills a month. It's enough for 10 day if I'm only taking 3.. I'm not quite understanding this. Thank you for any info you have or suggestions.  ~Amreen

## 2018-05-28 MED ORDER — MONTELUKAST SODIUM 10 MG PO TABS: 10.0000 mg | ORAL_TABLET | Freq: Every day | ORAL | 5 refills | Status: DC

## 2018-05-28 NOTE — Addendum Note (Signed)
Addended by: Mariane Duval on: 05/28/2018 02:13 PM   Modules accepted: Orders

## 2018-06-11 ENCOUNTER — Other Ambulatory Visit: Payer: Self-pay | Admitting: Family

## 2018-06-11 DIAGNOSIS — G43009 Migraine without aura, not intractable, without status migrainosus: Secondary | ICD-10-CM

## 2018-06-11 DIAGNOSIS — I10 Essential (primary) hypertension: Secondary | ICD-10-CM

## 2018-06-18 ENCOUNTER — Other Ambulatory Visit: Payer: Self-pay

## 2018-06-18 DIAGNOSIS — F4323 Adjustment disorder with mixed anxiety and depressed mood: Secondary | ICD-10-CM

## 2018-06-18 MED ORDER — SERTRALINE HCL 50 MG PO TABS
50.0000 mg | ORAL_TABLET | Freq: Every day | ORAL | 0 refills | Status: DC
Start: 2018-06-18 — End: 2018-09-19

## 2018-07-08 ENCOUNTER — Ambulatory Visit (INDEPENDENT_AMBULATORY_CARE_PROVIDER_SITE_OTHER): Payer: BC Managed Care – PPO | Admitting: Allergy & Immunology

## 2018-07-08 ENCOUNTER — Other Ambulatory Visit: Payer: Self-pay

## 2018-07-08 ENCOUNTER — Encounter: Payer: Self-pay | Admitting: Allergy & Immunology

## 2018-07-08 DIAGNOSIS — J310 Chronic rhinitis: Secondary | ICD-10-CM

## 2018-07-08 DIAGNOSIS — L232 Allergic contact dermatitis due to cosmetics: Secondary | ICD-10-CM

## 2018-07-08 MED ORDER — AZELASTINE HCL 0.1 % NA SOLN: 2.0000 | Freq: Two times a day (BID) | NASAL | 5 refills | Status: DC

## 2018-07-08 NOTE — Patient Instructions (Addendum)
1. Non-allergic rhinitis - We will send in azelastine two sprays per nostril up to twice daily to see if this can help dry out the nose. - This is a nasal antihistamine. - Samples of Karbinal provided as well (liquid form of Ryvent essentially). - Start at 5 mL twice daily, but you can increase up to 20 mL twice daily. - Call us in 1-2 weeks to let us know what you think.   2. Contact dermatitis - Continue with the scent that you are tolerating.   3. Return in about 6 months (around 01/08/2019). This can be an in-person, a virtual Webex or a telephone follow up visit.   Please inform us of any Emergency Department visits, hospitalizations, or changes in symptoms. Call us before going to the ED for breathing or allergy symptoms since we might be able to fit you in for a sick visit. Feel free to contact us anytime with any questions, problems, or concerns.  It was a pleasure to talk to you today today!  Websites that have reliable patient information: 1. American Academy of Asthma, Allergy, and Immunology: www.aaaai.org 2. Food Allergy Research and Education (FARE): foodallergy.org 3. Mothers of Asthmatics: http://www.asthmacommunitynetwork.org 4. American College of Allergy, Asthma, and Immunology: www.acaai.org  "Like" [place] on Facebook and Instagram for our latest updates!      Make sure you are registered to vote! If you have moved or changed any of your contact information, you will need to get this updated before voting!    Voter ID laws are NOT going into effect for the General Election in November 2020! DO NOT let this stop you from exercising your right to vote!

## 2018-07-08 NOTE — Progress Notes (Signed)
RE: Heather [name] MRN: [id number] DOB: 12/03/1975 Date of Telemedicine Visit: 07/08/2018  Referring provider: Allegra GranaArnett, Margaret G, FNP Primary care provider: Allegra GranaArnett, Margaret G, FNP  Chief Complaint: Follow-up and Allergies   Telemedicine Follow Up Visit via Telephone: I connected with Heather [name] for a follow up on 07/08/18 by telephone and verified that I am speaking with the correct person using two identifiers.   I discussed the limitations, risks, security and privacy concerns of performing an evaluation and management service by telephone and the availability of in person appointments. I also discussed with the patient that there may be a patient responsible charge related to this service. The patient expressed understanding and agreed to proceed.  Patient is at home accompanied by her husband who provided/contributed to the history.  Provider is at the office.  Visit start time: 3:25 PM Visit end time: 3:45 PM Insurance consent/check in by: [employer] Medical consent and medical assistant/nurse: Ashleigh  History of Present Illness:  She is a 43 y.o. female, who is being followed for chronic NAR. Her previous allergy office visit was in February 2020 with Dr. Dellis AnesGallagher.  She was last seen in February 2020.  At that time, we started her on Xhance 1 spray per nostril twice daily.  We also started her on RyVent 1 tablet up to 3 times daily.  For her contact dermatitis, I recommended that she change back to her previously tolerated deodorant.  We did discuss doing patch testing, but she seemed to have a good idea of her triggers so we deferred on that.   Since the last visit, she tells me that she stopped all of her medications since they did not seem to be working. The Ryvent did not seem to help at all. She was concerned with wearing a mask since her nose always runs; however she notes that when she wears the mask her nose stops running.  I did recommend that she may be  wearing her mask all the time to stop the runny nose, but she tells me she does not want a where she is inside the house.  She was frustrated with the nose spray.  She stopped taking it quite a while ago.  She never felt that it did much anyway.  She has been on the nasal ipratropium which did work for around 2 weeks.  She has never been on Astelin.  Otherwise, there have been no changes to her past medical history, surgical history, family history, or social history.  Assessment and Plan:  Heather [name] is a 43 y.o. 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 Non-allergic rhinitis - We will send in azelastine two sprays per nostril up to twice daily to see if this can help dry out the nose. - This is a nasal antihistamine. - Samples of Karbinal provided as well (liquid form of Ryvent essentially). - Start at 5 mL twice daily, but you can increase up to 20 mL twice daily. - Call us in 1-2 weeks to let us know what you think.   2. Contact dermatitis - Continue with the scent that you are tolerating.   3. Return in about 6 months (around 01/08/2019). This can be an in-person, a virtual Webex or a telephone follow up visit.    Diagnostics: None.  Medication List:  Current Outpatient Medications  Medication Sig Dispense Refill   Diclofenac Sodium (PENNSAID) 2 % SOLN Place onto the skin.  hydrochlorothiazide (HYDRODIURIL) 25 MG tablet Take 1 tablet by mouth once daily 90 tablet 0   metoprolol succinate (TOPROL-XL) 100 MG 24 hr tablet TAKE 1 TABLET BY MOUTH ONCE DAILY TAKE  WITH  OR  IMMEDIATELY  FOLLOWING  A  MEAL 90 tablet 0   rizatriptan (MAXALT) 10 MG tablet TAKE 1 TABLET BY MOUTH AS NEEDED FOR MIGRAINE. MAY REPEAT IN 2 HOURS IF NEEDED 10 tablet 5   sertraline (ZOLOFT) 50 MG tablet Take 1 tablet (50 mg total) by mouth at bedtime. 90 tablet 0   montelukast (SINGULAIR) 10 MG tablet Take 1 tablet (10 mg total) by mouth at bedtime.  (Patient not taking: Reported on 07/08/2018) 30 tablet 5   No current facility-administered medications for this visit.    Allergies: Allergies  Allergen Reactions   Gluten Meal Diarrhea   I reviewed her past medical history, social history, family history, and environmental history and no significant changes have been reported from previous visits.  Review of Systems  Constitutional: Negative for activity change and appetite change.  HENT: Positive for postnasal drip and rhinorrhea. Negative for congestion, sinus pressure and sore throat.   Eyes: Negative for pain, discharge, redness and itching.  Respiratory: Negative for shortness of breath, wheezing and stridor.   Gastrointestinal: Negative for diarrhea, nausea and vomiting.  Musculoskeletal: Negative for arthralgias, joint swelling and myalgias.  Skin: Negative for rash.  Allergic/Immunologic: Negative for environmental allergies and food allergies.    Objective:  Physical exam not obtained as encounter was done via telephone.   Previous notes and tests were reviewed.  I discussed the assessment and treatment plan with the patient. The patient was provided an opportunity to ask questions and all were answered. The patient agreed with the plan and demonstrated an understanding of the instructions.   The patient was advised to call back or seek an in-person evaluation if the symptoms worsen or if the condition fails to improve as anticipated.  I provided 20 minutes of non-face-to-face time during this encounter.  It was my pleasure to participate in Heather [name]'s care today. Please feel free to contact me with any questions or concerns.   Sincerely,  [name], MD

## 2018-07-13 ENCOUNTER — Encounter: Payer: Self-pay | Admitting: Allergy & Immunology

## 2018-07-25 ENCOUNTER — Encounter: Payer: Self-pay | Admitting: Allergy & Immunology

## 2018-07-25 MED ORDER — CARBINOXAMINE MALEATE ER 4 MG/5ML PO SUER: 20.0000 mL | Freq: Two times a day (BID) | ORAL | 5 refills | Status: DC

## 2018-09-19 ENCOUNTER — Other Ambulatory Visit: Payer: Self-pay

## 2018-09-19 ENCOUNTER — Encounter: Payer: Self-pay | Admitting: Family

## 2018-09-19 ENCOUNTER — Telehealth (INDEPENDENT_AMBULATORY_CARE_PROVIDER_SITE_OTHER): Payer: BC Managed Care – PPO | Admitting: Family

## 2018-09-19 DIAGNOSIS — I10 Essential (primary) hypertension: Secondary | ICD-10-CM

## 2018-09-19 DIAGNOSIS — G43009 Migraine without aura, not intractable, without status migrainosus: Secondary | ICD-10-CM

## 2018-09-19 DIAGNOSIS — F4323 Adjustment disorder with mixed anxiety and depressed mood: Secondary | ICD-10-CM

## 2018-09-19 DIAGNOSIS — Z1239 Encounter for other screening for malignant neoplasm of breast: Secondary | ICD-10-CM

## 2018-09-19 MED ORDER — RIZATRIPTAN BENZOATE 10 MG PO TABS: ORAL_TABLET | ORAL | 5 refills | Status: DC

## 2018-09-19 MED ORDER — HYDROCHLOROTHIAZIDE 25 MG PO TABS: 25.0000 mg | ORAL_TABLET | Freq: Every day | ORAL | 0 refills | Status: DC

## 2018-09-19 MED ORDER — SERTRALINE HCL 100 MG PO TABS: 100.0000 mg | ORAL_TABLET | Freq: Every day | ORAL | 1 refills | Status: DC

## 2018-09-19 MED ORDER — SERTRALINE HCL 50 MG PO TABS: 50.0000 mg | ORAL_TABLET | Freq: Every day | ORAL | 0 refills | Status: DC

## 2018-09-19 NOTE — Patient Instructions (Addendum)
Trial increase of zoloft to 100mg .  Let me know if this is helpful in regards to sleep  Please call call and schedule your 3D mammogram as discussed at [facility]. I have ordered.   Stay safe!

## 2018-09-19 NOTE — Assessment & Plan Note (Signed)
At baseline. Continue regimen.  Advised that if headache would ever change in presentation, frequency to let me know.  Patient verbalized understanding.

## 2018-09-19 NOTE — Assessment & Plan Note (Signed)
In setting of trouble sleeping, advised that we go ahead and increase Zoloft 100 mg.  If not, we discussed augmenting with trazodone.  Patient will let me know how she is doing.

## 2018-09-19 NOTE — Assessment & Plan Note (Signed)
Suspect control. Continue regimen.

## 2018-09-19 NOTE — Progress Notes (Signed)
This visit type was conducted due to national recommendations for restrictions regarding the COVID-19 pandemic (e.g. social distancing).  This format is felt to be most appropriate for this patient at this time.  All issues noted in this document were discussed and addressed.  No physical exam was performed (except for noted visual exam findings with Video Visits). Virtual Visit via Video Note  I connected with@  on 09/19/18 at 10:30 AM EDT by a video enabled telemedicine application and verified that I am speaking with the correct person using two identifiers.  Location patient: home Location provider:work  Persons participating in the virtual visit: patient, provider  I discussed the limitations of evaluation and management by telemedicine and the availability of in person appointments. The patient expressed understanding and agreed to proceed.   HPI:  Follow up for medication refills.  Feels well, no new complaints  Migraine-unchanged from prior.  Occurs around the time of her menses. NO vision changes, loss vision, vertigo, confusion. Takes with [facility] Powder.  HTN- compliant with medications.  Does not have blood pressure cuff at home NO cp.   Anxiety and depression- Feels well on zoloft.   Still has trouble falling asleep. This has been ongoing for years.  Taking Tylenol PM without relief. Had been on Ambien but sleep walked.   Mammogram due   ROS: See pertinent positives and negatives per HPI.  Past Medical History:  Diagnosis Date  . Hyperlipidemia   . Hypertension   . Nail fungus 06.14.2014    Past Surgical History:  Procedure Laterality Date  . VAGINAL DELIVERY      Family History  Problem Relation Age of Onset  . Hypertension Mother   . Heart disease Father   . Heart disease Maternal Grandmother   . Heart disease Maternal Grandfather   . CVA Paternal Grandmother   . Breast cancer Neg Hx   . Allergic rhinitis Neg Hx   . Eczema Neg Hx   . Asthma Neg Hx   .  Urticaria Neg Hx     SOCIAL HX: never smoker   Current Outpatient Medications:  .  azelastine (ASTELIN) 0.1 % nasal spray, Place 2 sprays into both nostrils 2 (two) times daily. Use in each nostril as directed, Disp: 30 mL, Rfl: 5 .  Diclofenac Sodium (PENNSAID) 2 % SOLN, Place onto the skin., Disp: , Rfl:  .  hydrochlorothiazide (HYDRODIURIL) 25 MG tablet, Take 1 tablet (25 mg total) by mouth daily., Disp: 90 tablet, Rfl: 0 .  metoprolol succinate (TOPROL-XL) 100 MG 24 hr tablet, TAKE 1 TABLET BY MOUTH ONCE DAILY TAKE  WITH  OR  IMMEDIATELY  FOLLOWING  A  MEAL, Disp: 90 tablet, Rfl: 0 .  rizatriptan (MAXALT) 10 MG tablet, TAKE 1 TABLET BY MOUTH AS NEEDED FOR MIGRAINE. MAY REPEAT IN 2 HOURS IF NEEDED, Disp: 10 tablet, Rfl: 5 .  sertraline (ZOLOFT) 100 MG tablet, Take 1 tablet (100 mg total) by mouth at bedtime., Disp: 90 tablet, Rfl: 1  EXAM:  VITALS per patient if applicable: BP Readings from Last 3 Encounters:  04/08/18 112/70  03/04/18 116/78  09/27/17 114/82     GENERAL: alert, oriented, appears well and in no acute distress  HEENT: atraumatic, conjunttiva clear, no obvious abnormalities on inspection of external nose and ears  NECK: normal movements of the head and neck  LUNGS: on inspection no signs of respiratory distress, breathing rate appears normal, no obvious gross SOB, gasping or wheezing  CV: no obvious cyanosis  MS: moves all visible extremities without noticeable abnormality  PSYCH/NEURO: pleasant and cooperative, no obvious depression or anxiety, speech and thought processing grossly intact  ASSESSMENT AND PLAN:  Discussed the following assessment and plan:  Problem List Items Addressed This Visit      Cardiovascular and Mediastinum   Hypertension - Primary    Suspect control. Continue regimen.       Relevant Orders   CBC with Differential/Platelet   Comprehensive metabolic panel   Hemoglobin A1c   Lipid panel   TSH   VITAMIN D 25 Hydroxy (Vit-D  Deficiency, Fractures)   Migraine    At baseline. Continue regimen.  Advised that if headache would ever change in presentation, frequency to let me know.  Patient verbalized understanding.      Relevant Medications   sertraline (ZOLOFT) 100 MG tablet     Other   Adjustment disorder with mixed anxiety and depressed mood    In setting of trouble sleeping, advised that we go ahead and increase Zoloft 100 mg.  If not, we discussed augmenting with trazodone.  Patient will let me know how she is doing.       Relevant Medications   sertraline (ZOLOFT) 100 MG tablet    Other Visit Diagnoses    Screening for breast cancer       Relevant Orders   MM 3D SCREEN BREAST BILATERAL     Of note, patient understands schedule mammogram.   I discussed the assessment and treatment plan with the patient. The patient was provided an opportunity to ask questions and all were answered. The patient agreed with the plan and demonstrated an understanding of the instructions.   The patient was advised to call back or seek an in-person evaluation if the symptoms worsen or if the condition fails to improve as anticipated.   Rennie PlowmanMargaret Arnett, FNP

## 2018-09-20 ENCOUNTER — Other Ambulatory Visit: Payer: Self-pay | Admitting: Family

## 2018-09-20 DIAGNOSIS — I10 Essential (primary) hypertension: Secondary | ICD-10-CM

## 2018-09-20 DIAGNOSIS — G43009 Migraine without aura, not intractable, without status migrainosus: Secondary | ICD-10-CM

## 2018-09-22 ENCOUNTER — Other Ambulatory Visit: Payer: Self-pay

## 2018-09-22 DIAGNOSIS — G43009 Migraine without aura, not intractable, without status migrainosus: Secondary | ICD-10-CM

## 2018-09-22 DIAGNOSIS — I10 Essential (primary) hypertension: Secondary | ICD-10-CM

## 2018-09-22 MED ORDER — METOPROLOL SUCCINATE ER 100 MG PO TB24: ORAL_TABLET | ORAL | 3 refills | Status: DC

## 2018-09-24 ENCOUNTER — Other Ambulatory Visit: Payer: Self-pay | Admitting: Family

## 2018-09-24 DIAGNOSIS — N6489 Other specified disorders of breast: Secondary | ICD-10-CM

## 2018-10-06 ENCOUNTER — Encounter: Payer: Self-pay | Admitting: Family

## 2018-10-08 ENCOUNTER — Ambulatory Visit: Payer: BC Managed Care – PPO

## 2018-10-08 ENCOUNTER — Other Ambulatory Visit: Payer: Self-pay | Admitting: Family

## 2018-10-08 DIAGNOSIS — F4323 Adjustment disorder with mixed anxiety and depressed mood: Secondary | ICD-10-CM

## 2018-10-08 MED ORDER — TRAZODONE HCL 50 MG PO TABS: 25.0000 mg | ORAL_TABLET | Freq: Every evening | ORAL | 3 refills | Status: DC | PRN

## 2018-10-22 ENCOUNTER — Other Ambulatory Visit: Payer: Self-pay

## 2018-10-22 ENCOUNTER — Ambulatory Visit (INDEPENDENT_AMBULATORY_CARE_PROVIDER_SITE_OTHER): Payer: BC Managed Care – PPO | Admitting: Family

## 2018-10-22 ENCOUNTER — Encounter: Payer: Self-pay | Admitting: Family

## 2018-10-22 DIAGNOSIS — I10 Essential (primary) hypertension: Secondary | ICD-10-CM

## 2018-10-22 LAB — CBC WITH DIFFERENTIAL/PLATELET
Basophils Absolute: 0.1 10*3/uL (ref 0.0–0.1)
Basophils Relative: 1.1 % (ref 0.0–3.0)
Eosinophils Absolute: 0.1 10*3/uL (ref 0.0–0.7)
Eosinophils Relative: 1.6 % (ref 0.0–5.0)
HCT: 38.8 % (ref 36.0–46.0)
Hemoglobin: 13.2 g/dL (ref 12.0–15.0)
Lymphocytes Relative: 38.6 % (ref 12.0–46.0)
Lymphs Abs: 2.2 10*3/uL (ref 0.7–4.0)
MCHC: 34.1 g/dL (ref 30.0–36.0)
MCV: 85.4 fl (ref 78.0–100.0)
Monocytes Absolute: 0.4 10*3/uL (ref 0.1–1.0)
Monocytes Relative: 6.4 % (ref 3.0–12.0)
Neutro Abs: 3 10*3/uL (ref 1.4–7.7)
Neutrophils Relative %: 52.3 % (ref 43.0–77.0)
Platelets: 286 10*3/uL (ref 150.0–400.0)
RBC: 4.54 Mil/uL (ref 3.87–5.11)
RDW: 13.9 % (ref 11.5–15.5)
WBC: 5.7 10*3/uL (ref 4.0–10.5)

## 2018-10-22 LAB — LIPID PANEL
Cholesterol: 253 mg/dL — ABNORMAL HIGH (ref 0–200)
HDL: 60.7 mg/dL (ref >39.00)
LDL Cholesterol: 177 mg/dL — ABNORMAL HIGH (ref 0–99)
NonHDL: 191.85
Total CHOL/HDL Ratio: 4
Triglycerides: 74 mg/dL (ref 0.0–149.0)
VLDL: 14.8 mg/dL (ref 0.0–40.0)

## 2018-10-22 LAB — COMPREHENSIVE METABOLIC PANEL
ALT: 11 U/L (ref 0–35)
AST: 16 U/L (ref 0–37)
Albumin: 4.4 g/dL (ref 3.5–5.2)
Alkaline Phosphatase: 48 U/L (ref 39–117)
BUN: 17 mg/dL (ref 6–23)
CO2: 28 mEq/L (ref 19–32)
Calcium: 9.7 mg/dL (ref 8.4–10.5)
Chloride: 101 mEq/L (ref 96–112)
Creatinine, Ser: 0.83 mg/dL (ref 0.40–1.20)
GFR: 74.49 mL/min (ref >60.00)
Glucose, Bld: 94 mg/dL (ref 70–99)
Potassium: 3.6 mEq/L (ref 3.5–5.1)
Sodium: 138 mEq/L (ref 135–145)
Total Bilirubin: 0.9 mg/dL (ref 0.2–1.2)
Total Protein: 7.2 g/dL (ref 6.0–8.3)

## 2018-10-22 LAB — VITAMIN D 25 HYDROXY (VIT D DEFICIENCY, FRACTURES): VITD: 26.82 ng/mL — ABNORMAL LOW (ref 30.00–100.00)

## 2018-10-22 LAB — HEMOGLOBIN A1C: Hgb A1c MFr Bld: 5.4 % (ref 4.6–6.5)

## 2018-10-22 LAB — TSH: TSH: 1.66 u[IU]/mL (ref 0.35–4.50)

## 2018-10-31 ENCOUNTER — Emergency Department
Admission: EM | Admit: 2018-10-31 | Discharge: 2018-10-31 | Disposition: A | Discharge status: Home / Self Care | Payer: BC Managed Care – PPO | Attending: Student in an Organized Health Care Education/Training Program | Admitting: Student in an Organized Health Care Education/Training Program

## 2018-10-31 ENCOUNTER — Emergency Department: Payer: BC Managed Care – PPO

## 2018-10-31 ENCOUNTER — Other Ambulatory Visit: Payer: Self-pay

## 2018-10-31 DIAGNOSIS — Y939 Activity, unspecified: Secondary | ICD-10-CM | POA: Insufficient documentation

## 2018-10-31 DIAGNOSIS — Y92009 Unspecified place in unspecified non-institutional (private) residence as the place of occurrence of the external cause: Secondary | ICD-10-CM | POA: Insufficient documentation

## 2018-10-31 DIAGNOSIS — S80911A Unspecified superficial injury of right knee, initial encounter: Secondary | ICD-10-CM | POA: Diagnosis present

## 2018-10-31 DIAGNOSIS — Y999 Unspecified external cause status: Secondary | ICD-10-CM | POA: Diagnosis not present

## 2018-10-31 DIAGNOSIS — M7631 Iliotibial band syndrome, right leg: Secondary | ICD-10-CM | POA: Insufficient documentation

## 2018-10-31 DIAGNOSIS — S83421A Sprain of lateral collateral ligament of right knee, initial encounter: Secondary | ICD-10-CM

## 2018-10-31 DIAGNOSIS — X500XXA Overexertion from strenuous movement or load, initial encounter: Secondary | ICD-10-CM | POA: Diagnosis not present

## 2018-10-31 DIAGNOSIS — Z79899 Other long term (current) drug therapy: Secondary | ICD-10-CM | POA: Diagnosis not present

## 2018-10-31 DIAGNOSIS — I10 Essential (primary) hypertension: Secondary | ICD-10-CM | POA: Insufficient documentation

## 2018-10-31 MED ORDER — CYCLOBENZAPRINE HCL 5 MG PO TABS: 5.0000 mg | ORAL_TABLET | Freq: Three times a day (TID) | ORAL | 0 refills | Status: DC | PRN

## 2018-10-31 MED ORDER — CYCLOBENZAPRINE HCL 10 MG PO TABS
10.0000 mg | ORAL_TABLET | Freq: Once | ORAL | Status: AC
  Administered 2018-10-31: 10 mg via ORAL
  Filled 2018-10-31: qty 1

## 2018-10-31 MED ORDER — NAPROXEN 500 MG PO TABS: 500.0000 mg | ORAL_TABLET | Freq: Two times a day (BID) | ORAL | 0 refills | Status: AC

## 2018-10-31 NOTE — ED Notes (Signed)
See triage note  Presents with right leg pain  States she felt a pop and now having increased pain with bending

## 2018-10-31 NOTE — ED Provider Notes (Signed)
[facility] Emergency Department Provider Note ____________________________________________  Time seen: 1905  I have reviewed the triage vital signs and the nursing notes.  HISTORY  Chief Complaint  Leg Injury  HPI Heather [name] is a 43 y.o. female presents to the ED for evaluation of sudden right leg pain at the lateral thigh and knee.  Patient describes onset this morning, as she squatted down to put her dog into the kennel.  She describes an immediate palpable pop to the lateral knee, and noted immediate disability at the knee.  She reports pain unable to easily stand from a squatting position.  Since that time she has had pain stiffness to the lateral knee and thigh patient denies any fall, contusion, or other injury at this time.  She presents to the ED for evaluation of her right knee pain.  She denies any catching, click, lock, or give way.  She has been ambulating with a stiff leg gait secondary to pain.  She is not take any medications in the interim for symptom relief.   Past Medical History:  Diagnosis Date  . Hyperlipidemia   . Hypertension   . Nail fungus 06.14.2014    Patient Active Problem List   Diagnosis Date Noted  . Non-allergic rhinitis 03/04/2018  . Allergic rhinitis 09/27/2017  . Chronic fatigue 09/07/2014  . Routine general medical examination at a health care facility 08/05/2013  . Adjustment disorder with mixed anxiety and depressed mood 12/15/2012  . Migraine 05/11/2011  . Hypertension 04/06/2011  . Hyperlipidemia 04/06/2011    Past Surgical History:  Procedure Laterality Date  . VAGINAL DELIVERY      Prior to Admission medications   Medication Sig Start Date End Date Taking? Authorizing Provider  azelastine (ASTELIN) 0.1 % nasal spray Place 2 sprays into both nostrils 2 (two) times daily. Use in each nostril as directed 07/08/18   Alfonse SpruceGallagher, Joel Louis, MD  cyclobenzaprine (FLEXERIL) 5 MG tablet Take 1 tablet (5 mg total)  by mouth 3 (three) times daily as needed. 10/31/18   Riccardo Holeman, Charlesetta IvoryJenise V Bacon, PA-C  Diclofenac Sodium (PENNSAID) 2 % SOLN Place onto the skin.    [provider]  hydrochlorothiazide (HYDRODIURIL) 25 MG tablet Take 1 tablet (25 mg total) by mouth daily. 09/19/18   Allegra GranaArnett, Margaret G, FNP  metoprolol succinate (TOPROL-XL) 100 MG 24 hr tablet TAKE 1 TABLET BY MOUTH ONCE DAILY TAKE  WITH  OR  IMMEDIATELY  FOLLOWING  A  MEAL 09/22/18   Allegra GranaArnett, Margaret G, FNP  naproxen (NAPROSYN) 500 MG tablet Take 1 tablet (500 mg total) by mouth 2 (two) times daily with a meal for 15 days. 10/31/18 11/15/18  Mauro Arps, Charlesetta IvoryJenise V Bacon, PA-C  rizatriptan (MAXALT) 10 MG tablet TAKE 1 TABLET BY MOUTH AS NEEDED FOR MIGRAINE. MAY REPEAT IN 2 HOURS IF NEEDED 09/19/18   Allegra GranaArnett, Margaret G, FNP  sertraline (ZOLOFT) 100 MG tablet Take 1 tablet (100 mg total) by mouth at bedtime. 09/19/18   Allegra GranaArnett, Margaret G, FNP  traZODone (DESYREL) 50 MG tablet Take 0.5-1 tablets (25-50 mg total) by mouth at bedtime as needed for sleep. 10/08/18   Allegra GranaArnett, Margaret G, FNP    Allergies Gluten meal  Family History  Problem Relation Age of Onset  . Hypertension Mother   . Heart disease Father   . Heart disease Maternal Grandmother   . Heart disease Maternal Grandfather   . CVA Paternal Grandmother   . Breast cancer Neg Hx   . Allergic  rhinitis Neg Hx   . Eczema Neg Hx   . Asthma Neg Hx   . Urticaria Neg Hx     Social History Social History   Tobacco Use  . Smoking status: Never Smoker  . Smokeless tobacco: Never Used  Substance Use Topics  . Alcohol use: No  . Drug use: No    Review of Systems  Constitutional: Negative for fever. Cardiovascular: Negative for chest pain. Respiratory: Negative for shortness of breath. Musculoskeletal: Negative for back pain.  Right knee and thigh pain as above. Skin: Negative for rash. Neurological: Negative for headaches, focal weakness or  numbness. ____________________________________________  PHYSICAL EXAM:  VITAL SIGNS: ED Triage Vitals  Enc Vitals Group     BP 10/31/18 1845 132/83     Pulse Rate 10/31/18 1845 79     Resp 10/31/18 1845 18     Temp 10/31/18 1845 98.3 F (36.8 C)     Temp Source 10/31/18 1845 Oral     SpO2 10/31/18 1845 100 %     Weight 10/31/18 1846 145 lb (65.8 kg)     Height 10/31/18 1846 5\' 3"  (1.6 m)     Head Circumference --      Peak Flow --      Pain Score 10/31/18 1846 8     Pain Loc --      Pain Edu? --      Excl. in GC? --     Constitutional: Alert and oriented. Well appearing and in no distress. Head: Normocephalic and atraumatic. Eyes: Conjunctivae are normal. Normal extraocular movements Cardiovascular: Normal rate, regular rhythm. Normal distal pulses. Respiratory: Normal respiratory effort.  Musculoskeletal: Right knee without obvious deformity, dislocation, or effusion.  Patient is noted to have tenderness to the lateral aspect of the knee as well as tenderness along the IT band.  She is able demonstrate active flexion range, but it is slow and deliberate on exam.  No significant valgus or varus joint stress is elicited.  No popliteal space fullness is noted.  No calf or Achilles tenderness is noted distally.  Nontender with normal range of motion in all extremities.  Neurologic: Mildly antalgic gait without ataxia. Normal speech and language. No gross focal neurologic deficits are appreciated. Skin:  Skin is warm, dry and intact. No rash noted. ____________________________________________   RADIOLOGY  DG Right Knee Negative  ____________________________________________  PROCEDURES  Flexeril 10 mg PO Knee immobilizer Procedures ____________________________________________  INITIAL IMPRESSION / ASSESSMENT AND PLAN / ED COURSE  Patient with ED evaluation of lateral right knee and leg pain.  Patient's symptoms are consistent with a probable IT band strain versus a  collateral ligament strain to the right knee.  Patient's x-ray is negative for any acute findings patient placed in a knee immobilizer for comfort and support.  Prescription for Flexeril and naproxen are provided.  Patient is given GI bleed precautions and return instructions.  She will follow-up with Ortho for ongoing symptoms.  Heather [name] was evaluated in Emergency Department on 10/31/2018 for the symptoms described in the history of present illness. She was evaluated in the context of the global COVID-19 pandemic, which necessitated consideration that the patient might be at risk for infection with the SARS-CoV-2 virus that causes COVID-19. Institutional protocols and algorithms that pertain to the evaluation of patients at risk for COVID-19 are in a state of rapid change based on information released by regulatory bodies including the CDC and federal and state organizations. These policies and algorithms were  followed during the patient's care in the ED. ____________________________________________  FINAL CLINICAL IMPRESSION(S) / ED DIAGNOSES  Final diagnoses:  Iliotibial band syndrome of right side  Sprain of lateral collateral ligament of right knee, initial encounter         Melvenia Needles, PA-C 10/31/18 2317    Merlyn Lot, MD 10/31/18 2351

## 2018-10-31 NOTE — Discharge Instructions (Signed)
Your exam is concerning for a probable strain to the IT Band. Your XR was negative for fracture or dislocation. Take the prescription meds as directed. Wear the knee immobilizer as needed for support. Follow-up with Dr. Mack Guise as needed for continued symptoms.

## 2018-10-31 NOTE — ED Notes (Signed)
No peripheral IV placed this visit.   Discharge instructions reviewed with patient. Questions fielded by this RN. Patient verbalizes understanding of instructions. Patient discharged home in stable condition per Jenise, PA. No acute distress noted at time of discharge.   

## 2018-10-31 NOTE — ED Triage Notes (Signed)
Right leg pain and difficulty straightening leg after "popping sound". Pt was separating dogs apart. No injury. Pt alert and oriented X4, cooperative, RR even and unlabored, color WNL. Pt in NAD.

## 2018-11-03 ENCOUNTER — Ambulatory Visit (INDEPENDENT_AMBULATORY_CARE_PROVIDER_SITE_OTHER): Payer: BC Managed Care – PPO | Admitting: Family

## 2018-11-03 ENCOUNTER — Encounter: Payer: Self-pay | Admitting: Family

## 2018-11-03 DIAGNOSIS — G4700 Insomnia, unspecified: Secondary | ICD-10-CM | POA: Diagnosis not present

## 2018-11-03 DIAGNOSIS — F5101 Primary insomnia: Secondary | ICD-10-CM | POA: Insufficient documentation

## 2018-11-03 NOTE — Assessment & Plan Note (Signed)
Chronic. Failed trazodone, ambien, zoloft We agreed consult with psychiatry is next step. Referral placed.

## 2018-11-03 NOTE — Patient Instructions (Signed)
Today we discussed referrals, orders. Psychiatry to address insomnia    I have placed these orders in the system for you.  Please be sure to give [place] a call if you have not heard from our office regarding this. We should hear from [place] within ONE week with information regarding your appointment. If not, please let me know immediately.    Stay safe!

## 2018-11-03 NOTE — Progress Notes (Signed)
This visit type was conducted due to national recommendations for restrictions regarding the COVID-19 pandemic (e.g. social distancing).  This format is felt to be most appropriate for this patient at this time.  All issues noted in this document were discussed and addressed.  No physical exam was performed (except for noted visual exam findings with Video Visits). Virtual Visit via Video Note  I connected with@  on 11/03/18 at  1:30 PM EDT by a video enabled telemedicine application and verified that I am speaking with the correct person using two identifiers.  Location patient: home Location provider:work  Persons participating in the virtual visit: patient, provider  I discussed the limitations of evaluation and management by telemedicine and the availability of in person appointments. The patient expressed understanding and agreed to proceed.   HPI:  CC: trouble sleeping , chronic for years.   Trouble falling asleep not so much staying asleep.  Trazodone not helping,would wake up in middle of the night. Notes trazodone would put her sleep, just woudlnt stay asleep.   Walking in the evening. Feels restored after good nights rest. No snores.   In the past, she had slept walk [place].   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  Past Medical History:  Diagnosis Date  . Hyperlipidemia   . Hypertension   . Nail fungus 06.14.2014    Past Surgical History:  Procedure Laterality Date  . VAGINAL DELIVERY      Family History  Problem Relation Age of Onset  . Hypertension Mother   . Heart disease Father   . Heart disease Maternal Grandmother   . Heart disease Maternal Grandfather   . CVA Paternal Grandmother   . Breast cancer Neg Hx   . Allergic rhinitis Neg Hx   . Eczema Neg Hx   . Asthma Neg Hx   . Urticaria Neg Hx     SOCIAL HX: never smoker   Current Outpatient Medications:  .   azelastine (ASTELIN) 0.1 % nasal spray, Place 2 sprays into both nostrils 2 (two) times daily. Use in each nostril as directed, Disp: 30 mL, Rfl: 5 .  cyclobenzaprine (FLEXERIL) 5 MG tablet, Take 1 tablet (5 mg total) by mouth 3 (three) times daily as needed., Disp: 15 tablet, Rfl: 0 .  Diclofenac Sodium (PENNSAID) 2 % SOLN, Place onto the skin., Disp: , Rfl:  .  hydrochlorothiazide (HYDRODIURIL) 25 MG tablet, Take 1 tablet (25 mg total) by mouth daily., Disp: 90 tablet, Rfl: 0 .  metoprolol succinate (TOPROL-XL) 100 MG 24 hr tablet, TAKE 1 TABLET BY MOUTH ONCE DAILY TAKE  WITH  OR  IMMEDIATELY  FOLLOWING  A  MEAL, Disp: 90 tablet, Rfl: 3 .  naproxen (NAPROSYN) 500 MG tablet, Take 1 tablet (500 mg total) by mouth 2 (two) times daily with a meal for 15 days., Disp: 30 tablet, Rfl: 0 .  rizatriptan (MAXALT) 10 MG tablet, TAKE 1 TABLET BY MOUTH AS NEEDED FOR MIGRAINE. MAY REPEAT IN 2 HOURS IF NEEDED, Disp: 10 tablet, Rfl: 5 .  sertraline (ZOLOFT) 100 MG tablet, Take 1 tablet (100 mg total) by mouth at bedtime., Disp: 90 tablet, Rfl: 1  EXAM:  VITALS per patient if applicable:  GENERAL: alert, oriented, appears well and in no acute distress  HEENT: atraumatic, conjunttiva clear, no obvious abnormalities on inspection of external nose and ears  NECK: normal movements of the head and  neck  LUNGS: on inspection no signs of respiratory distress, breathing rate appears normal, no obvious gross SOB, gasping or wheezing  CV: no obvious cyanosis  MS: moves all visible extremities without noticeable abnormality  PSYCH/NEURO: pleasant and cooperative, no obvious depression or anxiety, speech and thought processing grossly intact  ASSESSMENT AND PLAN:  Discussed the following assessment and plan:  Problem List Items Addressed This Visit      Other   Insomnia - Primary    Chronic. Failed trazodone, ambien, zoloft We agreed consult with psychiatry is next step. Referral placed.       Relevant  Orders   Ambulatory referral to Psychiatry      -we discussed possible serious and likely etiologies, options for evaluation and workup, limitations of telemedicine visit vs in person visit, treatment, treatment risks and precautions. Pt prefers to treat via telemedicine empirically rather then risking or undertaking an in person visit at this moment. Patient agrees to seek prompt in person care if worsening, new symptoms arise, or if is not improving with treatment.   I discussed the assessment and treatment plan with the patient. The patient was provided an opportunity to ask questions and all were answered. The patient agreed with the plan and demonstrated an understanding of the instructions.   The patient was advised to call back or seek an in-person evaluation if the symptoms worsen or if the condition fails to improve as anticipated.   Mable Paris, FNP

## 2018-11-28 ENCOUNTER — Other Ambulatory Visit: Payer: Self-pay

## 2018-11-28 ENCOUNTER — Ambulatory Visit (INDEPENDENT_AMBULATORY_CARE_PROVIDER_SITE_OTHER): Payer: BC Managed Care – PPO | Admitting: Psychiatry

## 2018-11-28 ENCOUNTER — Encounter: Payer: Self-pay | Admitting: Psychiatry

## 2018-11-28 DIAGNOSIS — F4010 Social phobia, unspecified: Secondary | ICD-10-CM

## 2018-11-28 DIAGNOSIS — F5101 Primary insomnia: Secondary | ICD-10-CM | POA: Diagnosis not present

## 2018-11-28 MED ORDER — BELSOMRA 10 MG PO TABS: 10.0000 mg | ORAL_TABLET | Freq: Every day | ORAL | 1 refills | Status: DC

## 2018-11-28 MED ORDER — SERTRALINE HCL 100 MG PO TABS: 100.0000 mg | ORAL_TABLET | Freq: Every day | ORAL | 1 refills | Status: DC

## 2018-11-28 NOTE — Patient Instructions (Signed)
Suvorexant oral tablets °What is this medicine? °SUVOREXANT (su-vor-EX-ant) is used to treat insomnia. This medicine helps you to fall asleep and sleep through the night. °This medicine may be used for other purposes; ask your health care provider or pharmacist if you have questions. °COMMON BRAND NAME(S): Belsomra °What should I tell my health care provider before I take this medicine? °They need to know if you have any of these conditions: °· depression °· drink alcohol °· drug abuse or addiction °· feel sleepy or have fallen asleep suddenly during the day °· history of a sudden onset of muscle weakness (cataplexy) °· liver disease °· lung or breathing disease, like asthma or emphysema °· sleep apnea °· suicidal thoughts, plans, or attempt; a previous suicide attempt by you or a family member °· an unusual or allergic reaction to suvorexant, other medicines, foods, dyes, or preservatives °· pregnant or trying to get pregnant °· breast-feeding °How should I use this medicine? °Take this medicine by mouth within 30 minutes of going to bed. Do not take it unless you are able to stay in bed a full night before you must be active again. Follow the directions on the prescription label. You may take this medicine with or without a food. However, this medicine may take longer to work if you take it with or right after meals. Do not take your medicine more often than directed. Do not stop taking this medicine on your own. Always follow your doctor or health care professional's advice. °A special MedGuide will be given to you by the pharmacist with each prescription and refill. Be sure to read this information carefully each time. °Talk to your pediatrician regarding the use of this medicine in children. Special care may be needed. °Overdosage: If you think you have taken too much of this medicine contact a poison control center or emergency room at once. °NOTE: This medicine is only for you. Do not share this medicine with  others. °What if I miss a dose? °This medicine should only be taken immediately before going to sleep. Do not take double or extra doses. °What may interact with this medicine? °· alcohol °· antihistamines for allergy, cough, or cold °· aprepitant °· boceprevir °· certain antibiotics like ciprofloxacin, clarithromycin, erythromycin, telithromycin °· certain antivirals for HIV or AIDS °· certain medicines for anxiety or sleep °· certain medicines for depression like amitriptyline, fluoxetine, nefazodone, sertraline °· certain medicines for fungal infections like ketoconazole, posaconazole, fluconazole, itraconazole °· certain medicines for seizures like carbamazepine, phenobarbital, primidone, phenytoin °· conivaptan °· digoxin °· diltiazem °· general anesthetics like halothane, isoflurane, methoxyflurane, propofol °· grapefruit juice °· imatinib °· medicines that relax muscles for surgery °· narcotic medicines for pain °· phenothiazines like chlorpromazine, mesoridazine, prochlorperazine, thioridazine °· rifampin °· verapamil °This list may not describe all possible interactions. Give your health care provider a list of all the medicines, herbs, non-prescription drugs, or dietary supplements you use. Also tell them if you smoke, drink alcohol, or use illegal drugs. Some items may interact with your medicine. °What should I watch for while using this medicine? °Visit your health care professional for regular checks on your progress. Tell your health care professional if your symptoms do not start to get better or if they get worse. Avoid caffeine-containing drinks in the evening hours. °After taking this medicine, you may get up out of bed and do an activity that you do not know you are doing. The next morning, you may have no memory of this.   Activities include driving a car ("sleep-driving"), making and eating food, talking on the phone, sexual activity, and sleep-walking. Serious injuries have occurred. Call your  doctor right away if you find out you have done any of these activities. Do not take this medicine if you have used alcohol that evening. Do not take it if you have taken another medicine for sleep. °Do not take this medicine unless you are able to stay in bed for a full night (7 to 8 hours) and do not drive or perform other activities requiring full alertness within 8 hours of a dose. Do not drive, use machinery, or do anything that needs mental alertness the day after you take the 20 mg dose of this medicine. The use of lower doses (10 mg) may also cause driving impairment the next day. You may have a decrease in mental alertness the day after use, even if you feel that you are fully awake. Tell your doctor if you will need to perform activities requiring full alertness, such as driving, the next day. Do not stand or sit up quickly after taking this medicine, especially if you are an older patient. This reduces the risk of dizzy or fainting spells. °If you or your family notice any changes in your behavior, such as new or worsening depression, thoughts of harming yourself, anxiety, other unusual or disturbing thoughts, or memory loss, call your health care professional right away. °After you stop taking this medicine, you may have trouble falling asleep. This is called rebound insomnia. This problem usually goes away on its own after 1 or 2 nights. °What side effects may I notice from receiving this medicine? °Side effects that you should report to your doctor or health care professional as soon as possible: °· allergic reactions like skin rash, itching or hives, swelling of the face, lips, or tongue °· hallucinations °· periods of leg weakness lasting from seconds to a few minutes °· suicidal thoughts, mood changes °· unable to move or speak for several minutes while going to sleep or waking up °· unusual activities while not fully awake like driving, eating, making phone calls, or sexual activity °Side effects  that usually do not require medical attention (report these to your doctor or health care professional if they continue or are bothersome): °· daytime drowsiness °· headache °· nightmares or abnormal dreams °· tiredness °This list may not describe all possible side effects. Call your doctor for medical advice about side effects. You may report side effects to FDA at 1-800-FDA-1088. °Where should I keep my medicine? °Keep out of the reach of children. This medicine can be abused. Keep your medicine in a safe place to protect it from theft. Do not share this medicine with anyone. Selling or giving away this medicine is dangerous and against the law. °Store at room temperature between 15 and 30 degrees C (59 and 86 degrees F). Throw away any unused medicine after the expiration date. °NOTE: This sheet is a summary. It may not cover all possible information. If you have questions about this medicine, talk to your doctor, pharmacist, or health care provider. °© 2020 [employer] (2018-02-21 16:37:12) ° °

## 2018-11-28 NOTE — Progress Notes (Signed)
Virtual Visit via Video Note  I connected with Heather [name] on 11/28/18 at  9:00 AM EDT by a video enabled telemedicine application and verified that I am speaking with the correct person using two identifiers.   I discussed the limitations of evaluation and management by telemedicine and the availability of in person appointments. The patient expressed understanding and agreed to proceed    I discussed the assessment and treatment plan with the patient. The patient was provided an opportunity to ask questions and all were answered. The patient agreed with the plan and demonstrated an understanding of the instructions.   The patient was advised to call back or seek an in-person evaluation if the symptoms worsen or if the condition fails to improve as anticipated.   Psychiatric Initial Adult Assessment   Patient Identification: Heather [name] MRN:  [id number] Date of Evaluation:  11/28/2018 Referral Source: Heather Paris, FNP Chief Complaint:   Chief Complaint    Establish Care     Visit Diagnosis:    ICD-10-CM   1. Primary insomnia  F51.01 Suvorexant (BELSOMRA) 10 MG TABS  2. Social anxiety disorder  F40.10 sertraline (ZOLOFT) 100 MG tablet    History of Present Illness:  Heather [name] is a 43 year old Caucasian female, employed, married, lives in [place], has a history of social anxiety, insomnia, migraine headaches, hypertension was evaluated by telemedicine today.  Patient reports she has been struggling with social anxiety since the past several years.  She reports she was always painfully shy.  She reports she continues to struggle with not being able to participate much in group situations.  She tries to avoid them as much as possible.  She however reports she is able to work.  She works as a Control and instrumentation engineer.  She enjoys working with her children who are 33 years old.  She reports her anxiety symptoms does not affect her ability to function at work.  Patient reports  she currently takes a medication called Zoloft.  It does help to some extent with her anxiety symptoms.  She denies any side effects.  She has been on this medication since the past few months.  She reports she struggles with sleep on a regular basis.  She reports this has been going on for several years.  She reports she goes to bed at around 8:30 PM.  She stays asleep for at least 2 or 3 hours.  After that she is up.  She reports she walks around or she plays on her phone for a couple of hours and then finally is able to wind down and sleep again for the next few hours.  This has been going on for a long time.  She reports she has tried medications like Ambien which gave her hallucinations, Johnnye Sima which gave her a bad taste in her mouth, trazodone which did not work.  She reports she is interested in trying something new for her sleep.  Patient denies any substance abuse problems.  Patient does report she has a history of a traumatic divorce however denies any other history of trauma in her life.  Patient denies any manic or hypomanic episodes.  Patient denies any perceptual disturbances.  Patient denies any suicidality, homicidality.  Patient denies any other concerns today.  Patient lives with her husband and her daughter who is 18 years old.  She reports a good relationship with them.   Associated Signs/Symptoms: Depression Symptoms:  anxiety, disturbed sleep, (Hypo) Manic Symptoms:  denies Anxiety Symptoms:  Social  Anxiety, Psychotic Symptoms:  denies PTSD Symptoms: Negative  Past Psychiatric History: Patient was being treated by her primary care provider for her anxiety and insomnia.  Patient denies any inpatient mental health admissions.  Patient denies suicide attempts.  Previous Psychotropic Medications: Yes   Substance Abuse History in the last 12 months:  No.  Consequences of Substance Abuse: Negative  Past Medical History:  Past Medical History:  Diagnosis Date   . Hyperlipidemia   . Hypertension   . Nail fungus 06.14.2014    Past Surgical History:  Procedure Laterality Date  . VAGINAL DELIVERY      Family Psychiatric History: Mother-insomnia  Family History:  Family History  Problem Relation Age of Onset  . Hypertension Mother   . Insomnia Mother   . Heart disease Father   . Heart disease Maternal Grandmother   . Heart disease Maternal Grandfather   . CVA Paternal Grandmother   . Breast cancer Neg Hx   . Allergic rhinitis Neg Hx   . Eczema Neg Hx   . Asthma Neg Hx   . Urticaria Neg Hx     Social History:   Social History   Socioeconomic History  . Marital status: Married    Spouse name: Not on file  . Number of children: 1  . Years of education: Not on file  . Highest education level: Not on file  Occupational History  . Not on file  Social Needs  . Financial resource strain: Not on file  . Food insecurity    Worry: Not on file    Inability: Not on file  . Transportation needs    Medical: Not on file    Non-medical: Not on file  Tobacco Use  . Smoking status: Never Smoker  . Smokeless tobacco: Never Used  Substance and Sexual Activity  . Alcohol use: No  . Drug use: No  . Sexual activity: Yes    Partners: Male    Birth control/protection: Surgical  Lifestyle  . Physical activity    Days per week: Not on file    Minutes per session: Not on file  . Stress: Not on file  Relationships  . Social Musician on phone: Not on file    Gets together: Not on file    Attends religious service: Not on file    Active member of club or organization: Not on file    Attends meetings of clubs or organizations: Not on file    Relationship status: Not on file  Other Topics Concern  . Not on file  Social History Narrative   Lives with daughter and husband in [place]. Works as Estate manager/land agent.   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     Additional Social History: Patient is married.  She was divorced once.  She works as a Conservator, museum/gallery at a preschool.  Patient lives with her husband as well as her daughter who is 74 year old and a 43 year-old grandson in [place] .  Patient reports she was raised by her mother.  She has 1 brother with whom she has a good relationship with. Allergies:   Allergies  Allergen Reactions  . Gluten Meal Diarrhea    Metabolic Disorder Labs: Lab Results  Component Value Date   HGBA1C 5.4 10/22/2018   No results found for: PROLACTIN Lab Results  Component Value Date   CHOL 253 (H) 10/22/2018   TRIG 74.0 10/22/2018   HDL 60.70 10/22/2018   CHOLHDL 4 10/22/2018   VLDL 14.8  10/22/2018   LDLCALC 177 (H) 10/22/2018   LDLCALC 200 (H) 10/10/2015   Lab Results  Component Value Date   TSH 1.66 10/22/2018    Therapeutic Level Labs: No results found for: LITHIUM No results found for: CBMZ No results found for: VALPROATE  Current Medications: Current Outpatient Medications  Medication Sig Dispense Refill  . azelastine (ASTELIN) 0.1 % nasal spray Place 2 sprays into both nostrils 2 (two) times daily. Use in each nostril as directed 30 mL 5  . cyclobenzaprine (FLEXERIL) 5 MG tablet Take 1 tablet (5 mg total) by mouth 3 (three) times daily as needed. 15 tablet 0  . Diclofenac Sodium (PENNSAID) 2 % SOLN Place onto the skin.    . hydrochlorothiazide (HYDRODIURIL) 25 MG tablet Take 1 tablet (25 mg total) by mouth daily. 90 tablet 0  . metoprolol succinate (TOPROL-XL) 100 MG 24 hr tablet TAKE 1 TABLET BY MOUTH ONCE DAILY TAKE  WITH  OR  IMMEDIATELY  FOLLOWING  A  MEAL 90 tablet 3  . rizatriptan (MAXALT) 10 MG tablet TAKE 1 TABLET BY MOUTH AS NEEDED FOR MIGRAINE. MAY REPEAT IN 2 HOURS IF NEEDED 10 tablet 5  . sertraline (ZOLOFT) 100 MG tablet Take 1 tablet (100 mg total) by mouth daily with breakfast. 90 tablet 1  . Suvorexant (BELSOMRA) 10 MG TABS Take 10 mg by mouth at bedtime. 30 tablet 1  . XHANCE 93 MCG/ACT EXHU      No current facility-administered medications for this visit.      Musculoskeletal: Strength & Muscle Tone: UTA Gait & Station: normal Patient leans: N/A  Psychiatric Specialty Exam: Review of Systems  Psychiatric/Behavioral: The patient is nervous/anxious and has insomnia.   All other systems reviewed and are negative.   Last menstrual period 10/31/2018.There is no height or weight on file to calculate BMI.  General Appearance: Casual  Eye Contact:  Fair  Speech:  Clear and Coherent  Volume:  Normal  Mood:  Anxious  Affect:  Appropriate  Thought Process:  Goal Directed and Descriptions of Associations: Intact  Orientation:  Full (Time, Place, and Person)  Thought Content:  Logical  Suicidal Thoughts:  No  Homicidal Thoughts:  No  Memory:  Immediate;   Fair Recent;   Fair Remote;   Fair  Judgement:  Fair  Insight:  Fair  Psychomotor Activity:  Normal  Concentration:  Concentration: Fair and Attention Span: Fair  Recall:  [employer]Fair  Fund of Knowledge:Fair  Language: Fair  Akathisia:  No  Handed:  Right  AIMS (if indicated): Denies tremors, rigidity  Assets:  Communication Skills Desire for Improvement Social Support  ADL's:  Intact  Cognition: WNL  Sleep:  Poor   Screenings: PHQ2-9     [facility]/2018 in [facility] Office Visit from [facility]/2016 in [facility] Office Visit [facility]/2016 in [facility]   PHQ-2 Total Score  0  0  0      Assessment and Plan: [name] is a 43 year old Caucasian female who has a history of anxiety, insomnia, hypertension, migraine headaches was evaluated by telemedicine today.  Patient is biologically predisposed given her family history.  Patient denies any substance abuse problems.  She does have psychosocial stressors of the current pandemic.  Patient denies any suicidality.  Patient is motivated to start treatment.  Plan as noted below.  Plan Primary insomnia-unstable Discussed sleep hygiene techniques.  Provided her information on  resources like body scan meditation. Start Belsomra 10  mg p.o. nightly Provided medication education  Social anxiety disorder-some progress Discussed with patient to start taking Zoloft 100 mg in the morning since SSRIs can be stimulating and may affect her sleep at night. Also discussed with her the interaction between Zoloft and her rizatriptan including serotonin syndrome.  Offered referral to psychotherapy sessions however she declined.  Patient will benefit from labs-TSH if not already done.  Follow-up in clinic in 4 weeks or sooner if needed.  Nov 6 at 8:45 AM.  I have spent atleast 45 minutes non  face to face with patient today. More than 50 % of the time was spent for psychoeducation and supportive psychotherapy and care coordination. This note was generated in part or whole with voice recognition software. Voice recognition is usually quite accurate but there are transcription errors that can and very often do occur. I apologize for any typographical errors that were not detected and corrected.       Jomarie Longs, MD 10/9/202012:22 PM

## 2018-12-03 ENCOUNTER — Telehealth: Payer: Self-pay

## 2018-12-03 NOTE — Telephone Encounter (Signed)
PA was submitted and is pending  

## 2018-12-03 NOTE — Telephone Encounter (Signed)
received a fax that a pa was needed on medication belsomra

## 2018-12-04 NOTE — Telephone Encounter (Signed)
pa was approved from  12-03-18 to  12-02-21

## 2018-12-15 ENCOUNTER — Telehealth: Payer: Self-pay

## 2018-12-15 DIAGNOSIS — F5101 Primary insomnia: Secondary | ICD-10-CM

## 2018-12-15 MED ORDER — BELSOMRA 10 MG PO TABS: 20.0000 mg | ORAL_TABLET | Freq: Every day | ORAL | 1 refills | Status: DC

## 2018-12-15 NOTE — Telephone Encounter (Signed)
pt called states that the sleep medication not working takes her 3 hours to get to sleep.  

## 2018-12-15 NOTE — Telephone Encounter (Signed)
Returned call to patient.  She reports Belsomra is not effective at 10 mg.  She would like to go up on the dosage.  Will increase to 20 mg.  She will call me back with concerns.

## 2018-12-17 ENCOUNTER — Other Ambulatory Visit: Payer: Self-pay | Admitting: Family

## 2018-12-17 DIAGNOSIS — I10 Essential (primary) hypertension: Secondary | ICD-10-CM

## 2018-12-25 NOTE — Progress Notes (Signed)
Virtual Visit via Video Note  I connected with Heather [name] on 12/26/18 at  8:45 AM EST by a video enabled telemedicine application and verified that I am speaking with the correct person using two identifiers.   I discussed the limitations of evaluation and management by telemedicine and the availability of in person appointments. The patient expressed understanding and agreed to proceed.    I discussed the assessment and treatment plan with the patient. The patient was provided an opportunity to ask questions and all were answered. The patient agreed with the plan and demonstrated an understanding of the instructions.   The patient was advised to call back or seek an in-person evaluation if the symptoms worsen or if the condition fails to improve as anticipated.   BH MD OP Progress Note  12/26/2018 1:00 PM Heather [name]  MRN:  [id number]  Chief Complaint:  Chief Complaint    Follow-up     HPI:   Heather [name] is a 43 year old Caucasian female, employed, married, lives in [place], has a history of social anxiety disorder, insomnia, migraine headaches, hypertension was evaluated by telemedicine today.  Patient today reports she continues to struggle with sleep.  She reports she continues to wake up after every 2 or 3 hours.  She reports she sits on her recliner and tries to relax for 30 minutes or so and is able to fall back asleep.  She however feels tired when she wakes up since the sleep is disrupted a lot.  She reports the Belsomra 20 mg also did not help much.  She reports her anxiety symptoms are under control.  The Zoloft does help.  She currently takes it in the morning.  Patient denies any suicidality, homicidality or perceptual disturbances.  Patient reports work is going well.  Patient denies any other concerns today.      Visit Diagnosis:    ICD-10-CM   1. Primary insomnia  F51.01 doxepin (SINEQUAN) 10 MG capsule  2. Social anxiety disorder  F40.10      Past Psychiatric History: I have reviewed past psychiatric history from my progress note on 11/28/2018  Past Medical History:  Past Medical History:  Diagnosis Date  . Hyperlipidemia   . Hypertension   . Nail fungus 06.14.2014    Past Surgical History:  Procedure Laterality Date  . VAGINAL DELIVERY      Family Psychiatric History: Reviewed family psychiatric history from my progress note on 11/28/2018  Family History:  Family History  Problem Relation Age of Onset  . Hypertension Mother   . Insomnia Mother   . Heart disease Father   . Heart disease Maternal Grandmother   . Heart disease Maternal Grandfather   . CVA Paternal Grandmother   . Breast cancer Neg Hx   . Allergic rhinitis Neg Hx   . Eczema Neg Hx   . Asthma Neg Hx   . Urticaria Neg Hx     Social History: Reviewed social history from my progress note on 11/28/2018 Social History   Socioeconomic History  . Marital status: Married    Spouse name: Not on file  . Number of children: 1  . Years of education: Not on file  . Highest education level: Not on file  Occupational History  . Not on file  Social Needs  . Financial resource strain: Not on file  . Food insecurity    Worry: Not on file    Inability: Not on file  . Transportation needs    Medical:  Not on file    Non-medical: Not on file  Tobacco Use  . Smoking status: Never Smoker  . Smokeless tobacco: Never Used  Substance and Sexual Activity  . Alcohol use: No  . Drug use: No  . Sexual activity: Yes    Partners: Male    Birth control/protection: Surgical  Lifestyle  . Physical activity    Days per week: Not on file    Minutes per session: Not on file  . Stress: Not on file  Relationships  . Social Herbalist on phone: Not on file    Gets together: Not on file    Attends religious service: Not on file    Active member of club or organization: Not on file    Attends meetings of clubs or organizations: Not on file     Relationship status: Not on file  Other Topics Concern  . Not on file  Social History Narrative   Lives with daughter and husband in [place]. 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     Allergies:  Allergies  Allergen Reactions  . Gluten Meal Diarrhea    Metabolic Disorder Labs: Lab Results  Component Value Date   HGBA1C 5.4 10/22/2018   No results found for: PROLACTIN Lab Results  Component Value Date   CHOL 253 (H) 10/22/2018   TRIG 74.0 10/22/2018   HDL 60.70 10/22/2018   CHOLHDL 4 10/22/2018   VLDL 14.8 10/22/2018   LDLCALC 177 (H) 10/22/2018   LDLCALC 200 (H) 10/10/2015   Lab Results  Component Value Date   TSH 1.66 10/22/2018   TSH 1.74 10/10/2015    Therapeutic Level Labs: No results found for: LITHIUM No results found for: VALPROATE No components found for:  CBMZ  Current Medications: Current Outpatient Medications  Medication Sig Dispense Refill  . azelastine (ASTELIN) 0.1 % nasal spray Place 2 sprays into both nostrils 2 (two) times daily. Use in each nostril as directed 30 mL 5  . cyclobenzaprine (FLEXERIL) 5 MG tablet Take 1 tablet (5 mg total) by mouth 3 (three) times daily as needed. 15 tablet 0  . Diclofenac Sodium (PENNSAID) 2 % SOLN Place onto the skin.    Marland Kitchen doxepin (SINEQUAN) 10 MG capsule Take 1 capsule (10 mg total) by mouth at bedtime. SLEEP 30 capsule 1  . hydrochlorothiazide (HYDRODIURIL) 25 MG tablet Take 1 tablet by mouth once daily 90 tablet 1  . ipratropium (ATROVENT) 0.06 % nasal spray Place 2 sprays into both nostrils 4 (four) times daily.    . metoprolol succinate (TOPROL-XL) 100 MG 24 hr tablet TAKE 1 TABLET BY MOUTH ONCE DAILY TAKE  WITH  OR  IMMEDIATELY  FOLLOWING  A  MEAL 90 tablet 3  . rizatriptan (MAXALT) 10 MG tablet TAKE 1 TABLET BY MOUTH AS NEEDED FOR MIGRAINE. MAY REPEAT IN 2 HOURS IF NEEDED 10 tablet 5  . sertraline (ZOLOFT) 100 MG tablet Take 1 tablet (100 mg total) by mouth daily with breakfast. 90 tablet 1  . XHANCE  24 MCG/ACT EXHU      No current facility-administered medications for this visit.      Musculoskeletal: Strength & Muscle Tone: UTA Gait & Station: normal Patient leans: N/A  Psychiatric Specialty Exam: Review of Systems  Psychiatric/Behavioral: The patient has insomnia.   All other systems reviewed and are negative.   There were no vitals taken for this visit.There is no height or weight on file to calculate BMI.  General Appearance:  Casual  Eye Contact:  Fair  Speech:  Clear and Coherent  Volume:  Normal  Mood:  Euthymic  Affect:  Congruent  Thought Process:  Goal Directed and Descriptions of Associations: Intact  Orientation:  Full (Time, Place, and Person)  Thought Content: Logical   Suicidal Thoughts:  No  Homicidal Thoughts:  No  Memory:  Immediate;   Fair Recent;   Fair Remote;   Fair  Judgement:  Fair  Insight:  Fair  Psychomotor Activity:  Normal  Concentration:  Concentration: Fair and Attention Span: Fair  Recall:  [employer]Fair  Fund of Knowledge: Fair  Language: Fair  Akathisia:  No  Handed:  Right  AIMS (if indicated): uta  Assets:  Communication Skills Desire for Improvement Social Support  ADL's:  Intact  Cognition: WNL  Sleep:  Poor   Screenings: PHQ2-9     [facility]/2018 [facility] [place] [place]/2016 in [facility]  PHQ-2 Total Score  0  0  0       Assessment and Plan: [name] is a 43 year old Caucasian female who has a history of anxiety, insomnia, hypertension, migraine headaches was evaluated by telemedicine today.  She is biologically predisposed given her family history.  She currently denies any substance abuse problems.  She however does have psychosocial stressors of the current pandemic.  Patient continues to struggle with sleep problems.  She will benefit from medication readjustment.  Plan Primary  insomnia-unstable Discontinue Belsomra for lack of benefit. Start doxepin 10 mg p.o. nightly. Discussed with patient drug to drug interaction including serotonin syndrome. Will order EKG to monitor her heart.  She will get it from her primary care provider.  Social anxiety disorder-stable Zoloft 100 mg in the morning.  Patient was advised to take the Zoloft in the a.m. since she was having sleep problems.  Patient was offered referral for psychotherapy sessions last visit however she declined  Reviewed TSH-dated 10/22/2018-1.66-within normal limits.  Follow-up in clinic in 4 weeks or sooner if needed.  December 1 at 1:20 PM  I have spent atleast 15 minutes non face to face with patient today. More than 50 % of the time was spent for psychoeducation and supportive psychotherapy and care coordination. This note was generated in part or whole with voice recognition software. Voice recognition is usually quite accurate but there are transcription errors that can and very often do occur. I apologize for any typographical errors that were not detected and corrected.         Jomarie LongsSaramma Yenifer Saccente, MD 12/26/2018, 1:00 PM

## 2018-12-26 ENCOUNTER — Encounter: Payer: Self-pay | Admitting: Psychiatry

## 2018-12-26 ENCOUNTER — Other Ambulatory Visit: Payer: Self-pay

## 2018-12-26 ENCOUNTER — Ambulatory Visit (INDEPENDENT_AMBULATORY_CARE_PROVIDER_SITE_OTHER): Payer: BC Managed Care – PPO | Admitting: Psychiatry

## 2018-12-26 DIAGNOSIS — F4010 Social phobia, unspecified: Secondary | ICD-10-CM

## 2018-12-26 DIAGNOSIS — F5101 Primary insomnia: Secondary | ICD-10-CM | POA: Diagnosis not present

## 2018-12-26 MED ORDER — DOXEPIN HCL 10 MG PO CAPS: 10.0000 mg | ORAL_CAPSULE | Freq: Every day | ORAL | 1 refills | Status: DC

## 2018-12-29 ENCOUNTER — Telehealth: Payer: Self-pay | Admitting: Psychiatry

## 2018-12-29 NOTE — Telephone Encounter (Signed)
Returned call to pharmacy.  Received fax from pharmacy about drug to drug interaction between doxepin and sertraline-risk of serotonin syndrome.  Left message that we will monitor patient closely and that she is on a very low dosage of doxepin.  Patient was advised about the risk of serotonin syndrome during her visit last week.  It is okay to authorize doxepin 10 mg at bedtime along with her Zoloft.

## 2019-01-06 ENCOUNTER — Ambulatory Visit: Payer: BC Managed Care – PPO | Admitting: Allergy & Immunology

## 2019-01-09 ENCOUNTER — Other Ambulatory Visit: Payer: Self-pay | Admitting: Internal Medicine

## 2019-01-09 ENCOUNTER — Encounter: Payer: Self-pay | Admitting: Family

## 2019-01-09 DIAGNOSIS — J309 Allergic rhinitis, unspecified: Secondary | ICD-10-CM

## 2019-01-09 MED ORDER — IPRATROPIUM BROMIDE 0.06 % NA SOLN: 2.0000 | Freq: Four times a day (QID) | NASAL | 2 refills | Status: DC

## 2019-01-20 ENCOUNTER — Encounter: Payer: Self-pay | Admitting: Psychiatry

## 2019-01-20 ENCOUNTER — Other Ambulatory Visit: Payer: Self-pay

## 2019-01-20 ENCOUNTER — Ambulatory Visit (INDEPENDENT_AMBULATORY_CARE_PROVIDER_SITE_OTHER): Payer: BC Managed Care – PPO | Admitting: Psychiatry

## 2019-01-20 DIAGNOSIS — F4010 Social phobia, unspecified: Secondary | ICD-10-CM

## 2019-01-20 DIAGNOSIS — F5101 Primary insomnia: Secondary | ICD-10-CM | POA: Diagnosis not present

## 2019-01-20 MED ORDER — DOXEPIN HCL 10 MG PO CAPS: 10.0000 mg | ORAL_CAPSULE | Freq: Every evening | ORAL | 1 refills | Status: DC | PRN

## 2019-01-20 NOTE — Progress Notes (Signed)
Virtual Visit via Video Note  I connected with Heather [name] on 01/20/19 at  1:20 PM EST by a video enabled telemedicine application and verified that I am speaking with the correct person using two identifiers.   I discussed the limitations of evaluation and management by telemedicine and the availability of in person appointments. The patient expressed understanding and agreed to proceed.    I discussed the assessment and treatment plan with the patient. The patient was provided an opportunity to ask questions and all were answered. The patient agreed with the plan and demonstrated an understanding of the instructions.   The patient was advised to call back or seek an in-person evaluation if the symptoms worsen or if the condition fails to improve as anticipated.   BH MD OP Progress Note  01/20/2019 3:18 PM Heather [name]  MRN:  [id number]  Chief Complaint:  Chief Complaint    Follow-up     HPI: Heather [name] is a 43 year old Caucasian female, employed, married, lives in [place], has a history of social anxiety disorder, primary insomnia, migraine headaches, hypertension was evaluated by telemedicine today.  Patient today reports her anxiety symptoms are currently under control.  She is compliant on Zoloft.  She currently takes  Zoloft during the day.  Patient reports doxepin is helpful with her sleep.  She however reports it takes 3 to 4 hours for the doxepin to kick in and hence she has been taking it around 6:30 PM.  She falls asleep by 10:30 PM.  She is able to stay asleep until 5:30 AM.  She however reports she is interested in possible dosage increase to see if the higher dosage may help her to fall asleep quicker.  She denies any side effects.  Patient denies any suicidality, homicidality or perceptual disturbances.  Patient denies any other concerns today. Visit Diagnosis:    ICD-10-CM   1. Primary insomnia  F51.01 doxepin (SINEQUAN) 10 MG capsule  2. Social  anxiety disorder  F40.10     Past Psychiatric History: I have reviewed past psychiatric history from my progress note on 11/28/2018  Past Medical History:  Past Medical History:  Diagnosis Date  . Hyperlipidemia   . Hypertension   . Nail fungus 06.14.2014    Past Surgical History:  Procedure Laterality Date  . VAGINAL DELIVERY      Family Psychiatric History: Reviewed family psychiatric history from my progress note on 11/28/2018 Family History:  Family History  Problem Relation Age of Onset  . Hypertension Mother   . Insomnia Mother   . Heart disease Father   . Heart disease Maternal Grandmother   . Heart disease Maternal Grandfather   . CVA Paternal Grandmother   . Breast cancer Neg Hx   . Allergic rhinitis Neg Hx   . Eczema Neg Hx   . Asthma Neg Hx   . Urticaria Neg Hx     Social History: Reviewed social history from my progress note on 11/28/2018 Social History   Socioeconomic History  . Marital status: Married    Spouse name: Not on file  . Number of children: 1  . Years of education: Not on file  . Highest education level: Not on file  Occupational History  . Not on file  Social Needs  . Financial resource strain: Not on file  . Food insecurity    Worry: Not on file    Inability: Not on file  . Transportation needs    Medical: Not on file  Non-medical: Not on file  Tobacco Use  . Smoking status: Never Smoker  . Smokeless tobacco: Never Used  Substance and Sexual Activity  . Alcohol use: No  . Drug use: No  . Sexual activity: Yes    Partners: Male    Birth control/protection: Surgical  Lifestyle  . Physical activity    Days per week: Not on file    Minutes per session: Not on file  . Stress: Not on file  Relationships  . Social Herbalist on phone: Not on file    Gets together: Not on file    Attends religious service: Not on file    Active member of club or organization: Not on file    Attends meetings of clubs or organizations:  Not on file    Relationship status: Not on file  Other Topics Concern  . Not on file  Social History Narrative   Lives with daughter and husband in [place]. 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     Allergies:  Allergies  Allergen Reactions  . Gluten Meal Diarrhea    Metabolic Disorder Labs: Lab Results  Component Value Date   HGBA1C 5.4 10/22/2018   No results found for: PROLACTIN Lab Results  Component Value Date   CHOL 253 (H) 10/22/2018   TRIG 74.0 10/22/2018   HDL 60.70 10/22/2018   CHOLHDL 4 10/22/2018   VLDL 14.8 10/22/2018   LDLCALC 177 (H) 10/22/2018   LDLCALC 200 (H) 10/10/2015   Lab Results  Component Value Date   TSH 1.66 10/22/2018   TSH 1.74 10/10/2015    Therapeutic Level Labs: No results found for: LITHIUM No results found for: VALPROATE No components found for:  CBMZ  Current Medications: Current Outpatient Medications  Medication Sig Dispense Refill  . azelastine (ASTELIN) 0.1 % nasal spray Place 2 sprays into both nostrils 2 (two) times daily. Use in each nostril as directed 30 mL 5  . cyclobenzaprine (FLEXERIL) 5 MG tablet Take 1 tablet (5 mg total) by mouth 3 (three) times daily as needed. 15 tablet 0  . Diclofenac Sodium (PENNSAID) 2 % SOLN Place onto the skin.    Marland Kitchen doxepin (SINEQUAN) 10 MG capsule Take 1-2 capsules (10-20 mg total) by mouth at bedtime as needed. SLEEP 180 capsule 1  . hydrochlorothiazide (HYDRODIURIL) 25 MG tablet Take 1 tablet by mouth once daily 90 tablet 1  . ipratropium (ATROVENT) 0.06 % nasal spray Place 2 sprays into both nostrils 4 (four) times daily. 15 mL 2  . metoprolol succinate (TOPROL-XL) 100 MG 24 hr tablet TAKE 1 TABLET BY MOUTH ONCE DAILY TAKE  WITH  OR  IMMEDIATELY  FOLLOWING  A  MEAL 90 tablet 3  . rizatriptan (MAXALT) 10 MG tablet TAKE 1 TABLET BY MOUTH AS NEEDED FOR MIGRAINE. MAY REPEAT IN 2 HOURS IF NEEDED 10 tablet 5  . sertraline (ZOLOFT) 100 MG tablet Take 1 tablet (100 mg total) by mouth daily  with breakfast. 90 tablet 1  . XHANCE 71 MCG/ACT EXHU      No current facility-administered medications for this visit.      Musculoskeletal: Strength & Muscle Tone: UTA Gait & Station: normal Patient leans: N/A  Psychiatric Specialty Exam: Review of Systems  Psychiatric/Behavioral: The patient has insomnia.   All other systems reviewed and are negative.   There were no vitals taken for this visit.There is no height or weight on file to calculate BMI.  General Appearance: Casual  Eye  Contact:  Fair  Speech:  Normal Rate  Volume:  Normal  Mood:  Euthymic  Affect:  Congruent  Thought Process:  Goal Directed and Descriptions of Associations: Intact  Orientation:  Full (Time, Place, and Person)  Thought Content: Logical   Suicidal Thoughts:  No  Homicidal Thoughts:  No  Memory:  Immediate;   Fair Recent;   Fair Remote;   Fair  Judgement:  Fair  Insight:  Fair  Psychomotor Activity:  Normal  Concentration:  Concentration: Fair and Attention Span: Fair  Recall:  [employer] of Knowledge: Fair  Language: Fair  Akathisia:  No  Handed:  Right  AIMS (if indicated): denies tremors, rigidity  Assets:  Communication Skills Desire for Improvement Housing Social Support  ADL's:  Intact  Cognition: WNL  Sleep:  Improving   Screenings: PHQ2-9     Office Visit from 11/03/2018 in [facility] Office Visit from 09/26/2016 in [facility] Office Visit from 03/12/2016 in [facility]  PHQ-2 Total Score  0  0  0       Assessment and Plan: Erynne is a 43 year old Caucasian female who has a history of primary insomnia, social anxiety disorder, hypertension, migraine headaches was evaluated by telemedicine today.  Patient is biologically predisposed given her family history.  She currently denies any substance abuse problems.  Her anxiety symptoms are currently under control however she continues to struggle with sleep problems even  though doxepin is helpful.  Plan as noted below.  Plan Primary insomnia-improving Increase doxepin to 10 to 20 mg p.o. nightly as needed Patient is aware about drug to drug interaction including serotonin syndrome.  EKG ordered-pending.  Social anxiety disorder-stable Zoloft 100 mg p.o. daily  Follow-up in clinic in 1 month or sooner if needed.  January 5 at 11:20 AM  I have spent atleast 15 minutes non face to face with patient today. More than 50 % of the time was spent for psychoeducation and supportive psychotherapy and care coordination. This note was generated in part or whole with voice recognition software. Voice recognition is usually quite accurate but there are transcription errors that can and very often do occur. I apologize for any typographical errors that were not detected and corrected.       Jomarie Longs, MD 01/20/2019, 3:18 PM

## 2019-02-24 ENCOUNTER — Encounter: Payer: Self-pay | Admitting: Psychiatry

## 2019-02-24 ENCOUNTER — Ambulatory Visit (INDEPENDENT_AMBULATORY_CARE_PROVIDER_SITE_OTHER): Payer: BC Managed Care – PPO | Admitting: Psychiatry

## 2019-02-24 ENCOUNTER — Other Ambulatory Visit: Payer: Self-pay

## 2019-02-24 DIAGNOSIS — F4010 Social phobia, unspecified: Secondary | ICD-10-CM | POA: Diagnosis not present

## 2019-02-24 DIAGNOSIS — F5101 Primary insomnia: Secondary | ICD-10-CM | POA: Diagnosis not present

## 2019-02-24 NOTE — Progress Notes (Signed)
Virtual Visit via Video Note  I connected with Heather [name] on 02/24/19 at 11:20 AM EST by a video enabled telemedicine application and verified that I am speaking with the correct person using two identifiers.   I discussed the limitations of evaluation and management by telemedicine and the availability of in person appointments. The patient expressed understanding and agreed to proceed.    I discussed the assessment and treatment plan with the patient. The patient was provided an opportunity to ask questions and all were answered. The patient agreed with the plan and demonstrated an understanding of the instructions.   The patient was advised to call back or seek an in-person evaluation if the symptoms worsen or if the condition fails to improve as anticipated.   BH MD OP Progress Note  02/24/2019 12:27 PM Heather [name]  MRN:  [id number]  Chief Complaint:  Chief Complaint    Follow-up     HPI: Heather [name] is a 44 year old Caucasian female, employed, married, lives in [place], has a history of social anxiety disorder, primary insomnia, migraine headaches, hypertension was evaluated by telemedicine today.  Patient today reports she is currently doing well with regards to her anxiety symptoms.  She is compliant on Zoloft.  She however reports she is worried about weight gain.  She reports she has not been moving around much due to the COVID-19 pandemic.  She however wonders if the Zoloft could also be contributing to it.  She reports sleep continues to be good on the doxepin.  She however has to take it very early for the medication to start working around bedtime.  Patient denies any suicidality, homicidality or perceptual disturbances.  Patient denies any other concerns today. Visit Diagnosis:    ICD-10-CM   1. Primary insomnia  F51.01   2. Social anxiety disorder  F40.10     Past Psychiatric History: Reviewed past psychiatric history from my progress note on  11/28/2018.  Past Medical History:  Past Medical History:  Diagnosis Date  . Hyperlipidemia   . Hypertension   . Nail fungus 06.14.2014    Past Surgical History:  Procedure Laterality Date  . VAGINAL DELIVERY      Family Psychiatric History: Reviewed family psychiatric history from my progress note on 11/28/2018.  Family History:  Family History  Problem Relation Age of Onset  . Hypertension Mother   . Insomnia Mother   . Heart disease Father   . Heart disease Maternal Grandmother   . Heart disease Maternal Grandfather   . CVA Paternal Grandmother   . Breast cancer Neg Hx   . Allergic rhinitis Neg Hx   . Eczema Neg Hx   . Asthma Neg Hx   . Urticaria Neg Hx     Social History: Reviewed social history from my progress note on 11/28/2018. Social History   Socioeconomic History  . Marital status: Married    Spouse name: Not on file  . Number of children: 1  . Years of education: Not on file  . Highest education level: Not on file  Occupational History  . Not on file  Tobacco Use  . Smoking status: Never Smoker  . Smokeless tobacco: Never Used  Substance and Sexual Activity  . Alcohol use: No  . Drug use: No  . Sexual activity: Yes    Partners: Male    Birth control/protection: Surgical  Other Topics Concern  . Not on file  Social History Narrative   Lives with daughter and husband in [place].  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    Social Determinants of Health   Financial Resource Strain:   . Difficulty of Paying Living Expenses: Not on file  Food Insecurity:   . Worried About Charity fundraiser in the Last Year: Not on file  . Ran Out of Food in the Last Year: Not on file  Transportation Needs:   . Lack of Transportation (Medical): Not on file  . Lack of Transportation (Non-Medical): Not on file  Physical Activity:   . Days of Exercise per Week: Not on file  . Minutes of Exercise per Session: Not on file  Stress:   . Feeling of Stress : Not  on file  Social Connections:   . Frequency of Communication with Friends and Family: Not on file  . Frequency of Social Gatherings with Friends and Family: Not on file  . Attends Religious Services: Not on file  . Active Member of Clubs or Organizations: Not on file  . Attends Archivist Meetings: Not on file  . Marital Status: Not on file    Allergies:  Allergies  Allergen Reactions  . Gluten Meal Diarrhea    Metabolic Disorder Labs: Lab Results  Component Value Date   HGBA1C 5.4 10/22/2018   No results found for: PROLACTIN Lab Results  Component Value Date   CHOL 253 (H) 10/22/2018   TRIG 74.0 10/22/2018   HDL 60.70 10/22/2018   CHOLHDL 4 10/22/2018   VLDL 14.8 10/22/2018   LDLCALC 177 (H) 10/22/2018   LDLCALC 200 (H) 10/10/2015   Lab Results  Component Value Date   TSH 1.66 10/22/2018   TSH 1.74 10/10/2015    Therapeutic Level Labs: No results found for: LITHIUM No results found for: VALPROATE No components found for:  CBMZ  Current Medications: Current Outpatient Medications  Medication Sig Dispense Refill  . azelastine (ASTELIN) 0.1 % nasal spray Place 2 sprays into both nostrils 2 (two) times daily. Use in each nostril as directed 30 mL 5  . cyclobenzaprine (FLEXERIL) 5 MG tablet Take 1 tablet (5 mg total) by mouth 3 (three) times daily as needed. 15 tablet 0  . Diclofenac Sodium (PENNSAID) 2 % SOLN Place onto the skin.    Marland Kitchen doxepin (SINEQUAN) 10 MG capsule Take 1-2 capsules (10-20 mg total) by mouth at bedtime as needed. SLEEP 180 capsule 1  . hydrochlorothiazide (HYDRODIURIL) 25 MG tablet Take 1 tablet by mouth once daily 90 tablet 1  . ipratropium (ATROVENT) 0.06 % nasal spray Place 2 sprays into both nostrils 4 (four) times daily. 15 mL 2  . metoprolol succinate (TOPROL-XL) 100 MG 24 hr tablet TAKE 1 TABLET BY MOUTH ONCE DAILY TAKE  WITH  OR  IMMEDIATELY  FOLLOWING  A  MEAL 90 tablet 3  . rizatriptan (MAXALT) 10 MG tablet TAKE 1 TABLET BY MOUTH  AS NEEDED FOR MIGRAINE. MAY REPEAT IN 2 HOURS IF NEEDED 10 tablet 5  . sertraline (ZOLOFT) 100 MG tablet Take 1 tablet (100 mg total) by mouth daily with breakfast. 90 tablet 1  . XHANCE 4 MCG/ACT EXHU      No current facility-administered medications for this visit.     Musculoskeletal: Strength & Muscle Tone: UTA Gait & Station: normal Patient leans: N/A  Psychiatric Specialty Exam: Review of Systems  Constitutional: Positive for unexpected weight change.  Psychiatric/Behavioral: Negative for agitation, behavioral problems, confusion, decreased concentration, dysphoric mood, hallucinations, self-injury, sleep disturbance and suicidal ideas. The patient is not nervous/anxious  and is not hyperactive.   All other systems reviewed and are negative.   There were no vitals taken for this visit.There is no height or weight on file to calculate BMI.  General Appearance: Casual  Eye Contact:  Fair  Speech:  Normal Rate  Volume:  Normal  Mood:  Euthymic  Affect:  Appropriate  Thought Process:  Goal Directed and Descriptions of Associations: Intact  Orientation:  Full (Time, Place, and Person)  Thought Content: Logical   Suicidal Thoughts:  No  Homicidal Thoughts:  No  Memory:  Immediate;   Fair Recent;   Fair Remote;   Fair  Judgement:  Fair  Insight:  Fair  Psychomotor Activity:  Normal  Concentration:  Concentration: Fair and Attention Span: Fair  Recall:  [employer] of Knowledge: Fair  Language: Fair  Akathisia:  No  Handed:  Right  AIMS (if indicated): UTA  Assets:  Communication Skills Desire for Improvement Housing Social Support  ADL's:  Intact  Cognition: WNL  Sleep:  Fair   Screenings: PHQ2-9     Office Visit from 11/03/2018 in [facility] Office Visit from 09/26/2016 in [facility] Office Visit from 03/12/2016 in [facility]  PHQ-2 Total Score  0  0  0       Assessment and Plan: Reiley is a  44 year old Caucasian female who has a history of primary insomnia, social anxiety disorder, hypertension, migraine headaches was evaluated by telemedicine today.  Patient is biologically predisposed given her family history.  She is currently doing well on the current medication regimen although she is concerned about weight gain.  Plan as noted below.  Plan Primary insomnia-stable Doxepin 10 to 20 mg p.o. nightly as needed EKG ordered-pending  Social anxiety disorder-stable Zoloft 100 mg p.o. daily  Discussed with patient about possible referral to weight loss clinic.  Also provided education about low calorie diet, intermittent fasting, incorporating moderate walking, aerobics into her exercise routine.  Follow-up in clinic in 3 months or sooner if needed.  March 30 at 9 AM  I have spent atleast 20 minutes non face to face with patient today. More than 50 % of the time was spent for psychoeducation and supportive psychotherapy and care coordination. This note was generated in part or whole with voice recognition software. Voice recognition is usually quite accurate but there are transcription errors that can and very often do occur. I apologize for any typographical errors that were not detected and corrected.       Jomarie Longs, MD 02/24/2019, 12:27 PM

## 2019-03-25 ENCOUNTER — Encounter: Payer: Self-pay | Admitting: Family

## 2019-04-30 ENCOUNTER — Other Ambulatory Visit: Payer: Self-pay | Admitting: Family

## 2019-04-30 DIAGNOSIS — F4010 Social phobia, unspecified: Secondary | ICD-10-CM

## 2019-05-19 ENCOUNTER — Other Ambulatory Visit: Payer: Self-pay

## 2019-05-19 ENCOUNTER — Encounter: Payer: Self-pay | Admitting: Psychiatry

## 2019-05-19 ENCOUNTER — Ambulatory Visit (INDEPENDENT_AMBULATORY_CARE_PROVIDER_SITE_OTHER): Payer: BC Managed Care – PPO | Admitting: Psychiatry

## 2019-05-19 DIAGNOSIS — F5101 Primary insomnia: Secondary | ICD-10-CM

## 2019-05-19 DIAGNOSIS — F4010 Social phobia, unspecified: Secondary | ICD-10-CM

## 2019-05-19 NOTE — Progress Notes (Signed)
Provider Location : [facility] Patient Location : Advertising account executive Visit via Video Note  I connected with Heather [name] on 05/19/19 at  9:00 AM EDT by a video enabled telemedicine application and verified that I am speaking with the correct person using two identifiers.   I discussed the limitations of evaluation and management by telemedicine and the availability of in person appointments. The patient expressed understanding and agreed to proceed.   I discussed the assessment and treatment plan with the patient. The patient was provided an opportunity to ask questions and all were answered. The patient agreed with the plan and demonstrated an understanding of the instructions.   The patient was advised to call back or seek an in-person evaluation if the symptoms worsen or if the condition fails to improve as anticipated.   BH MD OP Progress Note  05/19/2019 11:22 AM Heather [name]  MRN:  [id number]  Chief Complaint:  Chief Complaint    Follow-up     HPI: Heather [name] is a 44 year old Caucasian female, employed, married, lives in [place], has a history of social anxiety disorder, primary insomnia, migraine headaches, hypertension was evaluated by telemedicine today.  Patient today reports she is currently doing well with regards to her anxiety symptoms.  She got fully vaccinated against COVID-19.  She reports her work continues to be okay.  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  She has 1 child who is doing remote learning.  She is able to cope with work-related needs.  Patient reports sleep is okay.  She reports her sleep is interrupted couple of times however she is able to fall back asleep quickly.  She feels well rested when she wakes up in the morning.  Patient denies any suicidality, homicidality or perceptual disturbances.  Patient reports she joined [employer] program and has lost 10 pounds in the past 2 months.    Patient denies  any other concerns today. Visit Diagnosis:    ICD-10-CM   1. Primary insomnia  F51.01   2. Social anxiety disorder  F40.10     Past Psychiatric History: I have reviewed past psychiatric history from my progress note on 11/28/2018.  Past Medical History:  Past Medical History:  Diagnosis Date  . Hyperlipidemia   . Hypertension   . Nail fungus 06.14.2014    Past Surgical History:  Procedure Laterality Date  . VAGINAL DELIVERY      Family Psychiatric History: I have reviewed family psychiatric history from my progress note on 11/28/2018.  Family History:  Family History  Problem Relation Age of Onset  . Hypertension Mother   . Insomnia Mother   . Heart disease Father   . Heart disease Maternal Grandmother   . Heart disease Maternal Grandfather   . CVA Paternal Grandmother   . Breast cancer Neg Hx   . Allergic rhinitis Neg Hx   . Eczema Neg Hx   . Asthma Neg Hx   . Urticaria Neg Hx     Social History: I have reviewed social history from my progress note on 11/28/2018. Social History   Socioeconomic History  . Marital status: Married    Spouse name: Not on file  . Number of children: 1  . Years of education: Not on file  . Highest education level: Not on file  Occupational History  . Not on file  Tobacco Use  . Smoking status: Never Smoker  . Smokeless tobacco: Never Used  Substance  and Sexual Activity  . Alcohol use: No  . Drug use: No  . Sexual activity: Yes    Partners: Male    Birth control/protection: Surgical  Other Topics Concern  . Not on file  Social History Narrative   Lives with daughter and husband in [place]. Works as Estate manager/land agent.   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    Social Determinants of Health   Financial Resource Strain:   . Difficulty of Paying Living Expenses:   Food Insecurity:   . Worried About Programme researcher, broadcasting/film/video in the Last Year:   . Barista in the Last Year:   Transportation Needs:   . Freight forwarder (Medical):   Marland Kitchen Lack of  Transportation (Non-Medical):   Physical Activity:   . Days of Exercise per Week:   . Minutes of Exercise per Session:   Stress:   . Feeling of Stress :   Social Connections:   . Frequency of Communication with Friends and Family:   . Frequency of Social Gatherings with Friends and Family:   . Attends Religious Services:   . Active Member of Clubs or Organizations:   . Attends Banker Meetings:   Marland Kitchen Marital Status:     Allergies:  Allergies  Allergen Reactions  . Gluten Meal Diarrhea    Metabolic Disorder Labs: Lab Results  Component Value Date   HGBA1C 5.4 10/22/2018   No results found for: PROLACTIN Lab Results  Component Value Date   CHOL 253 (H) 10/22/2018   TRIG 74.0 10/22/2018   HDL 60.70 10/22/2018   CHOLHDL 4 10/22/2018   VLDL 14.8 10/22/2018   LDLCALC 177 (H) 10/22/2018   LDLCALC 200 (H) 10/10/2015   Lab Results  Component Value Date   TSH 1.66 10/22/2018   TSH 1.74 10/10/2015    Therapeutic Level Labs: No results found for: LITHIUM No results found for: VALPROATE No components found for:  CBMZ  Current Medications: Current Outpatient Medications  Medication Sig Dispense Refill  . azelastine (ASTELIN) 0.1 % nasal spray Place 2 sprays into both nostrils 2 (two) times daily. Use in each nostril as directed 30 mL 5  . cyclobenzaprine (FLEXERIL) 5 MG tablet Take 1 tablet (5 mg total) by mouth 3 (three) times daily as needed. 15 tablet 0  . Diclofenac Sodium (PENNSAID) 2 % SOLN Place onto the skin.    Marland Kitchen doxepin (SINEQUAN) 10 MG capsule Take 1-2 capsules (10-20 mg total) by mouth at bedtime as needed. SLEEP 180 capsule 1  . hydrochlorothiazide (HYDRODIURIL) 25 MG tablet Take 1 tablet by mouth once daily 90 tablet 1  . ipratropium (ATROVENT) 0.06 % nasal spray Place 2 sprays into both nostrils 4 (four) times daily. 15 mL 2  . metoprolol succinate (TOPROL-XL) 100 MG 24 hr tablet TAKE 1 TABLET BY MOUTH ONCE DAILY TAKE  WITH  OR  IMMEDIATELY   FOLLOWING  A  MEAL 90 tablet 3  . rizatriptan (MAXALT) 10 MG tablet TAKE 1 TABLET BY MOUTH AS NEEDED FOR MIGRAINE. MAY REPEAT IN 2 HOURS IF NEEDED 10 tablet 5  . sertraline (ZOLOFT) 100 MG tablet TAKE 1 TABLET BY MOUTH AT BEDTIME (NEEDS  FOLLOW  UP  VISIT  FOR  REFILLS) 90 tablet 0  . XHANCE 93 MCG/ACT EXHU      No current facility-administered medications for this visit.     Musculoskeletal: Strength & Muscle Tone: UTA Gait & Station: normal Patient leans: N/A  Psychiatric Specialty Exam: Review of Systems  Psychiatric/Behavioral: Negative for agitation, behavioral problems, confusion, decreased concentration, dysphoric mood, hallucinations, self-injury, sleep disturbance and suicidal ideas. The patient is not nervous/anxious and is not hyperactive.   All other systems reviewed and are negative.   There were no vitals taken for this visit.There is no height or weight on file to calculate BMI.  General Appearance: Casual  Eye Contact:  Fair  Speech:  Normal Rate  Volume:  Normal  Mood:  Euthymic  Affect:  Congruent  Thought Process:  Goal Directed and Descriptions of Associations: Intact  Orientation:  Full (Time, Place, and Person)  Thought Content: Logical   Suicidal Thoughts:  No  Homicidal Thoughts:  No  Memory:  Immediate;   Fair Recent;   Fair Remote;   Fair  Judgement:  Fair  Insight:  Fair  Psychomotor Activity:  Normal  Concentration:  Concentration: Fair and Attention Span: Fair  Recall:  [employer] of Knowledge: Fair  Language: Fair  Akathisia:  No  Handed:  Right  AIMS (if indicated): UTA  Assets:  Communication Skills Desire for Improvement Housing Social Support  ADL's:  Intact  Cognition: WNL  Sleep:  Fair   Screenings: PHQ2-9     Office Visit from 11/03/2018 in [facility] Office Visit from 09/26/2016 in [facility] Office Visit from 03/12/2016 in [facility]  PHQ-2 Total Score  0  0  0        Assessment and Plan: Heather [name] is a 44 year old Caucasian female who has a history of primary insomnia, social anxiety disorder, hypertension, migraine headaches was evaluated by telemedicine today.  Patient is biologically predisposed given her family history.  She is currently doing well on current medication regimen.  Plan as noted below.  Plan Primary insomnia-stable Doxepin 10 to 20 mg p.o. nightly as needed EKG-pending  Social anxiety disorder-stable Zoloft 100 mg p.o. daily  Follow-up in clinic in 3 months or sooner if needed.  I have spent atleast 15 minutes non face to face with patient today. More than 50 % of the time was spent for ordering medications and test ,psychoeducation and supportive psychotherapy and care coordination,as well as documenting clinical information in electronic health record. This note was generated in part or whole with voice recognition software. Voice recognition is usually quite accurate but there are transcription errors that can and very often do occur. I apologize for any typographical errors that were not detected and corrected.       Ursula Alert, MD 05/19/2019, 11:22 AM

## 2019-06-20 ENCOUNTER — Other Ambulatory Visit: Payer: Self-pay | Admitting: Family

## 2019-06-20 DIAGNOSIS — I10 Essential (primary) hypertension: Secondary | ICD-10-CM

## 2019-08-03 ENCOUNTER — Other Ambulatory Visit: Payer: Self-pay | Admitting: Family

## 2019-08-03 DIAGNOSIS — F4010 Social phobia, unspecified: Secondary | ICD-10-CM

## 2019-08-03 MED ORDER — SERTRALINE HCL 100 MG PO TABS: 100.0000 mg | ORAL_TABLET | Freq: Every day | ORAL | 0 refills | Status: DC

## 2019-08-05 ENCOUNTER — Other Ambulatory Visit: Payer: Self-pay | Admitting: Psychiatry

## 2019-08-05 DIAGNOSIS — F5101 Primary insomnia: Secondary | ICD-10-CM

## 2019-08-19 ENCOUNTER — Other Ambulatory Visit: Payer: Self-pay

## 2019-08-19 ENCOUNTER — Telehealth (INDEPENDENT_AMBULATORY_CARE_PROVIDER_SITE_OTHER): Payer: BC Managed Care – PPO | Admitting: Psychiatry

## 2019-08-19 ENCOUNTER — Encounter: Payer: Self-pay | Admitting: Psychiatry

## 2019-08-19 DIAGNOSIS — F4010 Social phobia, unspecified: Secondary | ICD-10-CM | POA: Diagnosis not present

## 2019-08-19 DIAGNOSIS — F5101 Primary insomnia: Secondary | ICD-10-CM | POA: Diagnosis not present

## 2019-08-19 MED ORDER — RAMELTEON 8 MG PO TABS: 8.0000 mg | ORAL_TABLET | Freq: Every day | ORAL | 1 refills | Status: DC

## 2019-08-19 NOTE — Patient Instructions (Signed)
Ramelteon tablets What is this medicine? RAMELTEON (ram EL tee on) is used to treat insomnia. This medicine helps you to fall asleep. This medicine may be used for other purposes; ask your health care provider or pharmacist if you have questions. COMMON BRAND NAME(S): Rozerem What should I tell my health care provider before I take this medicine? They need to know if you have any of these conditions:  depression  history of a drug or alcohol abuse problem  liver disease  lung or breathing disease  suicidal thoughts  an unusual or allergic reaction to ramelteon, other medicines, foods, dyes, or preservatives  pregnant or trying to get pregnant  breast-feeding How should I use this medicine? Take this medicine by mouth with a glass of water. Do not break tablets; swallow whole. Follow the directions on the prescription label. It is better to take this medicine on an empty stomach and only when you are ready for bed. Do not take your medicine more often than directed. A special MedGuide will be given to you by the pharmacist with each prescription and refill. Be sure to read this information carefully each time. Talk to your pediatrician regarding the use of this medicine in children. Special care may be needed. Overdosage: If you think you have taken too much of this medicine contact a poison control center or emergency room at once. NOTE: This medicine is only for you. Do not share this medicine with others. What if I miss a dose? This does not apply. This medicine should only be taken immediately before going to sleep. Do not take double or extra doses. What may interact with this medicine? Do not take this medicine with any of the following medications:  fluvoxamine  melatonin This medicine may also interact with the following medications:  medicines used to treat fungal infections like ketoconazole, fluconazole, or itraconazole  rifampin This list may not describe all  possible interactions. Give your health care provider a list of all the medicines, herbs, non-prescription drugs, or dietary supplements you use. Also tell them if you smoke, drink alcohol, or use illegal drugs. Some items may interact with your medicine. What should I watch for while using this medicine? Visit your doctor or health care professional for regular checks on your progress. Keep a regular sleep schedule by going to bed at about the same time each night. Avoid caffeine-containing drinks in the evening hours. Talk to your doctor if you still have trouble sleeping within 7 to 10 days of using this medicine. This may mean there is another cause for your sleep problems. After taking this medicine, you may get up out of bed and do an activity that you do not know you are doing. The next morning, you may have no memory of this. Activities include driving a car ("sleep-driving"), making and eating food, talking on the phone, sexual activity, and sleep-walking. Serious injuries have occurred. Call your doctor right away if you find out you have done any of these activities. Do not take this medicine if you have used alcohol that evening. Do not take it if you have taken another medicine for sleep. The risk of doing these sleep-related activities is higher. Do not take this medicine unless you are able to stay in bed for a full night (7 to 8 hours) before you must be active again. You may have a decrease in mental alertness the day after use, even if you feel that you are fully awake. Tell your doctor if   you will need to perform activities requiring full alertness, such as driving, the next day. Do not stand or sit up quickly after taking this medicine, especially if you are an older patient. This reduces the risk of dizzy or fainting spells. If you or your family notice any changes in your behavior, such as new or worsening depression, thoughts of harming yourself, anxiety, other unusual or disturbing  thoughts, or memory loss, call your doctor right away. What side effects may I notice from receiving this medicine? Side effects that you should report to your doctor or health care professional as soon as possible:  allergic reactions like skin rash, itching or hives, swelling of the face, lips, or tongue  breast milk production or discharge  breathing problems  joint or muscle pain  depression, suicidal thoughts  missed monthly period (for women)  unusual activities while asleep like driving, eating, making phone calls  unusually weak or tired  worsening of insomnia Side effects that usually do not require medical attention (report to your doctor or health care professional if they continue or are bothersome):  bad taste  daytime sleepiness  decreased sex drive  diarrhea  headache  nausea This list may not describe all possible side effects. Call your doctor for medical advice about side effects. You may report side effects to FDA at 1-800-FDA-1088. Where should I keep my medicine? Keep out of the reach of children. Store at room temperature between 15 and 30 degrees C (59 and 86 degrees F). Keep the container tightly closed. Protect from moisture and humidity. Throw away any unused medicine after the expiration date. NOTE: This sheet is a summary. It may not cover all possible information. If you have questions about this medicine, talk to your doctor, pharmacist, or health care provider.  2020 Elsevier/Gold Standard (2017-08-02 12:18:24)  

## 2019-08-19 NOTE — Progress Notes (Signed)
Provider Location : [facility] Patient Location : Home  Virtual Visit via Video Note  I connected with Heather [name] on 08/19/19 at  2:00 PM EDT by a video enabled telemedicine application and verified that I am speaking with the correct person using two identifiers.   I discussed the limitations of evaluation and management by telemedicine and the availability of in person appointments. The patient expressed understanding and agreed to proceed.   I discussed the assessment and treatment plan with the patient. The patient was provided an opportunity to ask questions and all were answered. The patient agreed with the plan and demonstrated an understanding of the instructions.   The patient was advised to call back or seek an in-person evaluation if the symptoms worsen or if the condition fails to improve as anticipated.   BH MD OP Progress Note  08/19/2019 2:19 PM Heather [name]  MRN:  [id number]  Chief Complaint:  Chief Complaint    Follow-up     HPI: Heather [name] is a 44 year old Caucasian female, employed, married, lives in [place], has a history of social anxiety disorder, primary insomnia, migraine headaches, hypertension was evaluated by telemedicine today.  Patient today reports she is currently doing well.  She denies any significant nervousness restlessness or other anxiety symptoms.  She reports she is compliant on medications.  Denies side effects.  She however does report sleep issues.  She reports the doxepin no longer helps her to sleep as it used to before.  She reports she goes to bed around 10 PM however is up by midnight.  She is unable to go back to sleep again.  She is trying to stay active during the day.  She reports she walks 3 miles every day with her dog.  She is also spending a lot of time outside doing other activities.  She however reports in spite of all this she is unable to tire herself to the point that she can get good sleep.  She does use  caffeinated drinks like soda however she reports that has never prevented her from sleeping in the past.  She however does report if she drinks sweet tea she is unable to sleep and she tends to avoid it.  She denies any suicidality, homicidality or perceptual disturbances.  Patient denies any other concerns today.  Visit Diagnosis:    ICD-10-CM   1. Primary insomnia  F51.01 ramelteon (ROZEREM) 8 MG tablet  2. Social anxiety disorder  F40.10     Past Psychiatric History: I have reviewed past psychiatric history from my progress note on 11/28/2018  Past Medical History:  Past Medical History:  Diagnosis Date  . Hyperlipidemia   . Hypertension   . Nail fungus 06.14.2014    Past Surgical History:  Procedure Laterality Date  . VAGINAL DELIVERY      Family Psychiatric History: I have reviewed family psychiatric history from my progress note on 11/28/2018  Family History:  Family History  Problem Relation Age of Onset  . Hypertension Mother   . Insomnia Mother   . Heart disease Father   . Heart disease Maternal Grandmother   . Heart disease Maternal Grandfather   . CVA Paternal Grandmother   . Breast cancer Neg Hx   . Allergic rhinitis Neg Hx   . Eczema Neg Hx   . Asthma Neg Hx   . Urticaria Neg Hx     Social History: I have reviewed social history from my progress note on 11/28/2018 Social  History   Socioeconomic History  . Marital status: Married    Spouse name: Not on file  . Number of children: 1  . Years of education: Not on file  . Highest education level: Not on file  Occupational History  . Not on file  Tobacco Use  . Smoking status: Never Smoker  . Smokeless tobacco: Never Used  Vaping Use  . Vaping Use: Never used  Substance and Sexual Activity  . Alcohol use: No  . Drug use: No  . Sexual activity: Yes    Partners: Male    Birth control/protection: Surgical  Other Topics Concern  . Not on file  Social History Narrative   Lives with daughter and  husband in [place]. Works as Estate manager/land agent.   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    Social Determinants of Health   Financial Resource Strain:   . Difficulty of Paying Living Expenses:   Food Insecurity:   . Worried About Programme researcher, broadcasting/film/video in the Last Year:   . Barista in the Last Year:   Transportation Needs:   . Freight forwarder (Medical):   Marland Kitchen Lack of Transportation (Non-Medical):   Physical Activity:   . Days of Exercise per Week:   . Minutes of Exercise per Session:   Stress:   . Feeling of Stress :   Social Connections:   . Frequency of Communication with Friends and Family:   . Frequency of Social Gatherings with Friends and Family:   . Attends Religious Services:   . Active Member of Clubs or Organizations:   . Attends Banker Meetings:   Marland Kitchen Marital Status:     Allergies:  Allergies  Allergen Reactions  . Gluten Meal Diarrhea    Metabolic Disorder Labs: Lab Results  Component Value Date   HGBA1C 5.4 10/22/2018   No results found for: PROLACTIN Lab Results  Component Value Date   CHOL 253 (H) 10/22/2018   TRIG 74.0 10/22/2018   HDL 60.70 10/22/2018   CHOLHDL 4 10/22/2018   VLDL 14.8 10/22/2018   LDLCALC 177 (H) 10/22/2018   LDLCALC 200 (H) 10/10/2015   Lab Results  Component Value Date   TSH 1.66 10/22/2018   TSH 1.74 10/10/2015    Therapeutic Level Labs: No results found for: LITHIUM No results found for: VALPROATE No components found for:  CBMZ  Current Medications: Current Outpatient Medications  Medication Sig Dispense Refill  . ampicillin (PRINCIPEN) 500 MG capsule Take 500 mg by mouth 2 (two) times daily.    Marland Kitchen azelastine (ASTELIN) 0.1 % nasal spray Place 2 sprays into both nostrils 2 (two) times daily. Use in each nostril as directed 30 mL 5  . cyclobenzaprine (FLEXERIL) 5 MG tablet Take 1 tablet (5 mg total) by mouth 3 (three) times daily as needed. 15 tablet 0  . Diclofenac Sodium (PENNSAID) 2 % SOLN Place onto the skin.    .  hydrochlorothiazide (HYDRODIURIL) 25 MG tablet Take 1 tablet by mouth once daily 90 tablet 0  . ipratropium (ATROVENT) 0.06 % nasal spray Place 2 sprays into both nostrils 4 (four) times daily. 15 mL 2  . metoprolol succinate (TOPROL-XL) 100 MG 24 hr tablet TAKE 1 TABLET BY MOUTH ONCE DAILY TAKE  WITH  OR  IMMEDIATELY  FOLLOWING  A  MEAL 90 tablet 3  . metroNIDAZOLE (METROGEL) 0.75 % gel APPLY TOPICALLY TO AFFECTED AREA ONCE DAILY.    . ramelteon (ROZEREM) 8 MG tablet Take 1 tablet (8 mg  total) by mouth at bedtime. 30 tablet 1  . rizatriptan (MAXALT) 10 MG tablet TAKE 1 TABLET BY MOUTH AS NEEDED FOR MIGRAINE. MAY REPEAT IN 2 HOURS IF NEEDED 10 tablet 5  . sertraline (ZOLOFT) 100 MG tablet Take 1 tablet (100 mg total) by mouth daily. 90 tablet 0  . XHANCE 93 MCG/ACT EXHU      No current facility-administered medications for this visit.     Musculoskeletal: Strength & Muscle Tone: UTA Gait & Station: normal Patient leans: N/A  Psychiatric Specialty Exam: Review of Systems  Psychiatric/Behavioral: Positive for sleep disturbance. Negative for agitation, behavioral problems, confusion, decreased concentration, dysphoric mood, hallucinations, self-injury and suicidal ideas. The patient is not nervous/anxious and is not hyperactive.   All other systems reviewed and are negative.   There were no vitals taken for this visit.There is no height or weight on file to calculate BMI.  General Appearance: Casual  Eye Contact:  Fair  Speech:  Clear and Coherent  Volume:  Normal  Mood:  Euthymic  Affect:  Congruent  Thought Process:  Goal Directed and Descriptions of Associations: Intact  Orientation:  Full (Time, Place, and Person)  Thought Content: Logical   Suicidal Thoughts:  No  Homicidal Thoughts:  No  Memory:  Immediate;   Fair Recent;   Fair Remote;   Fair  Judgement:  Fair  Insight:  Fair  Psychomotor Activity:  Normal  Concentration:  Concentration: Fair and Attention Span: Fair   Recall:  [employer] of Knowledge: Fair  Language: Fair  Akathisia:  No  Handed:  Right  AIMS (if indicated): UTA  Assets:  Communication Skills Desire for Improvement Housing Social Support  ADL's:  Intact  Cognition: WNL  Sleep:  Poor   Screenings: PHQ2-9     Office Visit from 11/03/2018 in [facility] Office Visit from 09/26/2016 in [facility] Office Visit from 03/12/2016 in [facility]  PHQ-2 Total Score 0 0 0       Assessment and Plan: Heather [name] is a 44 year old Caucasian female who has a history of primary insomnia, social anxiety disorder, hypertension, migraine headaches was evaluated by telemedicine today.  Patient is biologically predisposed given her family history.  Patient is currently doing well on the current medication regimen.  She reports mood symptoms are stable however continues to struggle with sleep.  Plan as noted below.  Plan Primary insomnia-stable Discontinue doxepin for lack of benefit. Start Rozerem 8 mg p.o. nightly. Discussed with patient to work on sleep hygiene and to avoid caffeinated drinks. Discussed with patient to reach out to writer in a week after trying the new medication.  If she continues to struggle with sleep then her medication needs to be changed.  Social anxiety disorder-stable Zoloft 100 mg p.o. daily  Follow-up in clinic in 8 weeks or sooner if needed.  I have spent atleast 20 minutes non face to face with patient today. More than 50 % of the time was spent for preparing to see the patient ( e.g., review of test, records ),  ordering medications and test ,psychoeducation and supportive psychotherapy and care coordination,as well as documenting clinical information in electronic health record. This note was generated in part or whole with voice recognition software. Voice recognition is usually quite accurate but there are transcription errors that can and very  often do occur. I apologize for any typographical errors that were not detected and corrected.  Jomarie LongsSaramma Arie Gable, MD 08/19/2019, 2:19 PM

## 2019-09-07 ENCOUNTER — Ambulatory Visit: Payer: BC Managed Care – PPO | Admitting: Family

## 2019-09-10 ENCOUNTER — Other Ambulatory Visit: Payer: Self-pay

## 2019-09-10 ENCOUNTER — Encounter: Payer: Self-pay | Admitting: Family

## 2019-09-10 ENCOUNTER — Ambulatory Visit (INDEPENDENT_AMBULATORY_CARE_PROVIDER_SITE_OTHER): Payer: BC Managed Care – PPO | Admitting: Family

## 2019-09-10 VITALS — BP 110/70 | HR 83 | Temp 98.7°F | Ht 63.0 in | Wt 149.6 lb

## 2019-09-10 DIAGNOSIS — I10 Essential (primary) hypertension: Secondary | ICD-10-CM | POA: Diagnosis not present

## 2019-09-10 DIAGNOSIS — R635 Abnormal weight gain: Secondary | ICD-10-CM

## 2019-09-10 DIAGNOSIS — G43009 Migraine without aura, not intractable, without status migrainosus: Secondary | ICD-10-CM | POA: Diagnosis not present

## 2019-09-10 LAB — COMPREHENSIVE METABOLIC PANEL
ALT: 11 U/L (ref 0–35)
AST: 15 U/L (ref 0–37)
Albumin: 4.4 g/dL (ref 3.5–5.2)
Alkaline Phosphatase: 52 U/L (ref 39–117)
BUN: 17 mg/dL (ref 6–23)
CO2: 30 mEq/L (ref 19–32)
Calcium: 9.7 mg/dL (ref 8.4–10.5)
Chloride: 98 mEq/L (ref 96–112)
Creatinine, Ser: 0.77 mg/dL (ref 0.40–1.20)
GFR: 80.9 mL/min (ref >60.00)
Glucose, Bld: 100 mg/dL — ABNORMAL HIGH (ref 70–99)
Potassium: 3.5 mEq/L (ref 3.5–5.1)
Sodium: 135 mEq/L (ref 135–145)
Total Bilirubin: 0.7 mg/dL (ref 0.2–1.2)
Total Protein: 7.1 g/dL (ref 6.0–8.3)

## 2019-09-10 LAB — LIPID PANEL
Cholesterol: 307 mg/dL — ABNORMAL HIGH (ref 0–200)
HDL: 64.1 mg/dL (ref >39.00)
LDL Cholesterol: 230 mg/dL — ABNORMAL HIGH (ref 0–99)
NonHDL: 243.24
Total CHOL/HDL Ratio: 5
Triglycerides: 68 mg/dL (ref 0.0–149.0)
VLDL: 13.6 mg/dL (ref 0.0–40.0)

## 2019-09-10 LAB — VITAMIN D 25 HYDROXY (VIT D DEFICIENCY, FRACTURES): VITD: 26.88 ng/mL — ABNORMAL LOW (ref 30.00–100.00)

## 2019-09-10 LAB — CBC WITH DIFFERENTIAL/PLATELET
Basophils Absolute: 0.1 10*3/uL (ref 0.0–0.1)
Basophils Relative: 1 % (ref 0.0–3.0)
Eosinophils Absolute: 0.1 10*3/uL (ref 0.0–0.7)
Eosinophils Relative: 1.5 % (ref 0.0–5.0)
HCT: 36.7 % (ref 36.0–46.0)
Hemoglobin: 12.5 g/dL (ref 12.0–15.0)
Lymphocytes Relative: 34.1 % (ref 12.0–46.0)
Lymphs Abs: 2.2 10*3/uL (ref 0.7–4.0)
MCHC: 34 g/dL (ref 30.0–36.0)
MCV: 80 fl (ref 78.0–100.0)
Monocytes Absolute: 0.5 10*3/uL (ref 0.1–1.0)
Monocytes Relative: 7.7 % (ref 3.0–12.0)
Neutro Abs: 3.5 10*3/uL (ref 1.4–7.7)
Neutrophils Relative %: 55.7 % (ref 43.0–77.0)
Platelets: 260 10*3/uL (ref 150.0–400.0)
RBC: 4.59 Mil/uL (ref 3.87–5.11)
RDW: 15.3 % (ref 11.5–15.5)
WBC: 6.4 10*3/uL (ref 4.0–10.5)

## 2019-09-10 LAB — TSH: TSH: 1.48 u[IU]/mL (ref 0.35–4.50)

## 2019-09-10 LAB — HEMOGLOBIN A1C: Hgb A1c MFr Bld: 5.7 % (ref 4.6–6.5)

## 2019-09-10 MED ORDER — SAXENDA 18 MG/3ML ~~LOC~~ SOPN: 0.6000 mg | PEN_INJECTOR | Freq: Every day | SUBCUTANEOUS | 3 refills | Status: DC

## 2019-09-10 NOTE — Assessment & Plan Note (Signed)
Reassured by normal logic exam today.  Patient had an episode over the past week which she had daily migraines;  we discussed potential medication overuse as she was using a triptan daily with relief however headache would recur the following day.  Patient assured me that headache presentation was similar to headaches that she has had in the past.  This was an unusual month as  typically her headaches once or twice per month.  We had a long discussion in regards to ordering MRI of the brain, consult with neurology.  Patient politely declines at this time.  Advised her in the future, if headache occurs with a similar pattern she needs to have an in person exam either here or urgent care.  We did discuss the role of Topamax, which may aid as well and her goal for weight loss.  We will continue discuss at future visits.  Patient will stay hypervigilant and certainly if headache presentation, onset or severity changes in any way, patient understands to seek immediate medical attention.

## 2019-09-10 NOTE — Assessment & Plan Note (Signed)
No contraindication to starting Saxenda at this time.  Discussed with patient FDA black box warning of medullary thyroid cancer.  Discussed and reviewed how to start increase his medication safely.  Patient verbalized understanding of all.  Close follow-up

## 2019-09-10 NOTE — Assessment & Plan Note (Signed)
Stable, continue current regimen 

## 2019-09-10 NOTE — Addendum Note (Signed)
Addended by: Warden Fillers on: 09/10/2019 10:23 AM   Modules accepted: Orders

## 2019-09-10 NOTE — Progress Notes (Signed)
Subjective:    Patient ID: Heather [name], female    DOB: 1975/05/16, 44 y.o.   MRN: [id number]  CC: Heather [name] is a 44 y.o. female who presents today for follow up.   HPI: Frustrated with weight gain, approx 15 lbs, over past year.  Doing weight watchers for 3 months. Walking 3 miles. In 2018 had done weight watchers and lost 30 lbs.  No personal or family h/o thyroid cancer.   HTN- compliant with medications. At home 110/70.  No CP, SOB.   Migraine- had migraine 'every day last week' which felt like prior migraines , just unusual as it occurred every day.  Not sure if related to weather. Using maxalt daily last week with relief, however HA wouldcome back the following day. Typically right sided behind right eye, started as teenager. Thinks menstrual related. No h/o aura, vision loss, numbness to face. Gets 3-4 migraines per month, this last month unusual.  HA didn't awaken her from sleep, nor increased pressure such during valsalva, intercourse.   Following with Dr Elna Breslow  Due pap, mammogram, colonoscopy; she follows with GYN for annual CPE and declines my ordering today .  HISTORY:  Past Medical History:  Diagnosis Date  . Hyperlipidemia   . Hypertension   . Nail fungus 06.14.2014   Past Surgical History:  Procedure Laterality Date  . VAGINAL DELIVERY     Family History  Problem Relation Age of Onset  . Hypertension Mother   . Insomnia Mother   . Heart disease Father   . Heart disease Maternal Grandmother   . Heart disease Maternal Grandfather   . CVA Paternal Grandmother   . Breast cancer Neg Hx   . Allergic rhinitis Neg Hx   . Eczema Neg Hx   . Asthma Neg Hx   . Urticaria Neg Hx   . Thyroid cancer Neg Hx     Allergies: Gluten meal Current Outpatient Medications on File Prior to Visit  Medication Sig Dispense Refill  . Diclofenac Sodium (PENNSAID) 2 % SOLN Place onto the skin.    . hydrochlorothiazide (HYDRODIURIL) 25 MG tablet Take 1 tablet by mouth  once daily 90 tablet 0  . ipratropium (ATROVENT) 0.06 % nasal spray Place 2 sprays into both nostrils 4 (four) times daily. 15 mL 2  . metoprolol succinate (TOPROL-XL) 100 MG 24 hr tablet TAKE 1 TABLET BY MOUTH ONCE DAILY TAKE  WITH  OR  IMMEDIATELY  FOLLOWING  A  MEAL 90 tablet 3  . metroNIDAZOLE (METROGEL) 0.75 % gel APPLY TOPICALLY TO AFFECTED AREA ONCE DAILY.    . ramelteon (ROZEREM) 8 MG tablet Take 1 tablet (8 mg total) by mouth at bedtime. 30 tablet 1  . rizatriptan (MAXALT) 10 MG tablet TAKE 1 TABLET BY MOUTH AS NEEDED FOR MIGRAINE. MAY REPEAT IN 2 HOURS IF NEEDED 10 tablet 5  . sertraline (ZOLOFT) 100 MG tablet Take 1 tablet (100 mg total) by mouth daily. 90 tablet 0  . XHANCE 93 MCG/ACT EXHU      No current facility-administered medications on file prior to visit.    Social History   Tobacco Use  . Smoking status: Never Smoker  . Smokeless tobacco: Never Used  Vaping Use  . Vaping Use: Never used  Substance Use Topics  . Alcohol use: No  . Drug use: No    Review of Systems  Constitutional: Negative for chills and fever.  Eyes: Negative for visual disturbance.  Respiratory: Negative for cough.  Cardiovascular: Negative for chest pain and palpitations.  Gastrointestinal: Negative for nausea and vomiting.  Neurological: Positive for headaches. Negative for dizziness.      Objective:    BP 110/70   Pulse 83   Temp 98.7 F (37.1 C) (Oral)   Ht 5\' 3"  (1.6 m)   Wt 149 lb 9.6 oz (67.9 kg)   LMP 08/25/2019 (Exact Date)   SpO2 99%   BMI 26.50 kg/m  BP Readings from Last 3 Encounters:  09/10/19 110/70  10/31/18 132/83  04/08/18 112/70   Wt Readings from Last 3 Encounters:  09/10/19 149 lb 9.6 oz (67.9 kg)  10/31/18 145 lb (65.8 kg)  04/08/18 143 lb 6.4 oz (65 kg)    Physical Exam Vitals reviewed.  Constitutional:      Appearance: She is well-developed.  HENT:     Mouth/Throat:     Pharynx: Uvula midline.  Eyes:     Conjunctiva/sclera: Conjunctivae  normal.     Pupils: Pupils are equal, round, and reactive to light.     Comments: Fundus normal bilaterally.   Cardiovascular:     Rate and Rhythm: Normal rate and regular rhythm.     Pulses: Normal pulses.     Heart sounds: Normal heart sounds.  Pulmonary:     Effort: Pulmonary effort is normal.     Breath sounds: Normal breath sounds. No wheezing, rhonchi or rales.  Skin:    General: Skin is warm and dry.  Neurological:     Mental Status: She is alert.     Cranial Nerves: No cranial nerve deficit.     Sensory: No sensory deficit.     Deep Tendon Reflexes:     Reflex Scores:      Bicep reflexes are 2+ on the right side and 2+ on the left side.      Patellar reflexes are 2+ on the right side and 2+ on the left side.    Comments: Grip equal and strong bilateral upper extremities. Gait strong and steady. Able to perform rapid alternating movement without difficulty.   Psychiatric:        Speech: Speech normal.        Behavior: Behavior normal.        Thought Content: Thought content normal.        Assessment & Plan:   Problem List Items Addressed This Visit      Cardiovascular and Mediastinum   Hypertension - Primary    Stable, continue current regimen      Relevant Orders   CBC with Differential/Platelet   Hemoglobin A1c   Comprehensive metabolic panel   Lipid panel   TSH   VITAMIN D 25 Hydroxy (Vit-D Deficiency, Fractures)   Migraine    Reassured by normal logic exam today.  Patient had an episode over the past week which she had daily migraines;  we discussed potential medication overuse as she was using a triptan daily with relief however headache would recur the following day.  Patient assured me that headache presentation was similar to headaches that she has had in the past.  This was an unusual month as  typically her headaches once or twice per month.  We had a long discussion in regards to ordering MRI of the brain, consult with neurology.  Patient politely  declines at this time.  Advised her in the future, if headache occurs with a similar pattern she needs to have an in person exam either here or urgent care.  We  did discuss the role of Topamax, which may aid as well and her goal for weight loss.  We will continue discuss at future visits.  Patient will stay hypervigilant and certainly if headache presentation, onset or severity changes in any way, patient understands to seek immediate medical attention.        Other   Weight gain    No contraindication to starting Saxenda at this time.  Discussed with patient FDA black box warning of medullary thyroid cancer.  Discussed and reviewed how to start increase his medication safely.  Patient verbalized understanding of all.  Close follow-up          I have discontinued Darol Destine. Brule's azelastine, cyclobenzaprine, and ampicillin. I am also having her maintain her Diclofenac Sodium, rizatriptan, metoprolol succinate, Xhance, ipratropium, hydrochlorothiazide, sertraline, metroNIDAZOLE, and ramelteon.   No orders of the defined types were placed in this encounter.   Return precautions given.   Risks, benefits, and alternatives of the medications and treatment plan prescribed today were discussed, and patient expressed understanding.   Education regarding symptom management and diagnosis given to patient on AVS.  Continue to follow with Allegra Grana, FNP for routine health maintenance.   Heather [name] and I agreed with plan.   Rennie Plowman, FNP

## 2019-09-10 NOTE — Patient Instructions (Addendum)
Make a follow up with GYN for mammogram and Pap  Trial of Saxenda. Please read information on medication below and remember black box warning that you may not take if you or a family member is diagnosed with thyroid cancer.    You may increase by 0.6 mg/day once a week. For instance, first week, take 0.6mg  [place] daily. Second week take 1.2 mg [place] daily. Third week take 1.8 mg [place] daily...   Max is 3 mg/day.    Remember the goal of weight loss is 1 to 2 pounds maximum per week. Its VERY reasonable to stay on a dose for a couple of weeks ( or more) prior to increasing. We dont want to increase too fast.   Good luck!    Liraglutide injection (Weight Management) What is this medicine? LIRAGLUTIDE (LIR a GLOO tide) is used to help people lose weight and maintain weight loss. It is used with a reduced-calorie diet and exercise. This medicine may be used for other purposes; ask your health care provider or pharmacist if you have questions. COMMON BRAND NAME(S): Saxenda What should I tell my health care provider before I take this medicine? They need to know if you have any of these conditions:  endocrine tumors (MEN 2) or if someone in your family had these tumors  gallbladder disease  high cholesterol  history of alcohol abuse problem  history of pancreatitis  kidney disease or if you are on dialysis  liver disease  previous swelling of the tongue, face, or lips with difficulty breathing, difficulty swallowing, hoarseness, or tightening of the throat  stomach problems  suicidal thoughts, plans, or attempt; a previous suicide attempt by you or a family member  thyroid cancer or if someone in your family had thyroid cancer  an unusual or allergic reaction to liraglutide, other medicines, foods, dyes, or preservatives  pregnant or trying to get pregnant  breast-feeding How should I use this medicine? This medicine is for injection under the skin of your upper leg, stomach area, or  upper arm. You will be taught how to prepare and give this medicine. Use exactly as directed. Take your medicine at regular intervals. Do not take it more often than directed. This drug comes with INSTRUCTIONS FOR USE. Ask your pharmacist for directions on how to use this drug. Read the information carefully. Talk to your pharmacist or health care provider if you have questions. It is important that you put your used needles and syringes in a special sharps container. Do not put them in a trash can. If you do not have a sharps container, call your pharmacist or healthcare provider to get one. A special MedGuide will be given to you by the pharmacist with each prescription and refill. Be sure to read this information carefully each time. Talk to your pediatrician regarding the use of this medicine in children. Special care may be needed. Overdosage: If you think you have taken too much of this medicine contact a poison control center or emergency room at once. NOTE: This medicine is only for you. Do not share this medicine with others. What if I miss a dose? If you miss a dose, take it as soon as you can. If it is almost time for your next dose, take only that dose. Do not take double or extra doses. If you miss your dose for 3 days or more, call your doctor or health care professional to talk about how to restart this medicine. What may interact with  this medicine?  insulin and other medicines for diabetes This list may not describe all possible interactions. Give your health care provider a list of all the medicines, herbs, non-prescription drugs, or dietary supplements you use. Also tell them if you smoke, drink alcohol, or use illegal drugs. Some items may interact with your medicine. What should I watch for while using this medicine? Visit your doctor or health care professional for regular checks on your progress. Drink plenty of fluids while taking this medicine. Check with your doctor or health  care professional if you get an attack of severe diarrhea, nausea, and vomiting. The loss of too much body fluid can make it dangerous for you to take this medicine. This medicine may affect blood sugar levels. Ask your healthcare provider if changes in diet or medicines are needed if you have diabetes. Patients and their families should watch out for worsening depression or thoughts of suicide. Also watch out for sudden changes in feelings such as feeling anxious, agitated, panicky, irritable, hostile, aggressive, impulsive, severely restless, overly excited and hyperactive, or not being able to sleep. If this happens, especially at the beginning of treatment or after a change in dose, call your health care professional. Women should inform their health care provider if they wish to become pregnant or think they might be pregnant. Losing weight while pregnant is not advised and may cause harm to the unborn child. Talk to your health care provider for more information. What side effects may I notice from receiving this medicine? Side effects that you should report to your doctor or health care professional as soon as possible:  allergic reactions like skin rash, itching or hives, swelling of the face, lips, or tongue  breathing problems  diarrhea that continues or is severe  lump or swelling on the neck  severe nausea  signs and symptoms of infection like fever or chills; cough; sore throat; pain or trouble passing urine  signs and symptoms of low blood sugar such as feeling anxious; confusion; dizziness; increased hunger; unusually weak or tired; increased sweating; shakiness; cold, clammy skin; irritable; headache; blurred vision; fast heartbeat; loss of consciousness  signs and symptoms of kidney injury like trouble passing urine or change in the amount of urine  trouble swallowing  unusual stomach upset or pain  vomiting Side effects that usually do not require medical attention  (report to your doctor or health care professional if they continue or are bothersome):  constipation  decreased appetite  diarrhea  fatigue  headache  nausea  pain, redness, or irritation at site where injected  stomach upset  stuffy or runny nose This list may not describe all possible side effects. Call your doctor for medical advice about side effects. You may report side effects to FDA at 1-800-FDA-1088. Where should I keep my medicine? Keep out of the reach of children. Store unopened pen in a refrigerator between 2 and 8 degrees C (36 and 46 degrees F). Do not freeze or use if the medicine has been frozen. Protect from light and excessive heat. After you first use the pen, it can be stored at room temperature between 15 and 30 degrees C (59 and 86 degrees F) or in a refrigerator. Throw away your used pen after 30 days or after the expiration date, whichever comes first. Do not store your pen with the needle attached. If the needle is left on, medicine may leak from the pen. NOTE: This sheet is a summary. It may not cover  all possible information. If you have questions about this medicine, talk to your doctor, pharmacist, or health care provider.  2020 Elsevier/Gold Standard (2018-12-11 21:16:59)

## 2019-09-14 ENCOUNTER — Telehealth: Payer: Self-pay

## 2019-09-14 ENCOUNTER — Encounter: Payer: Self-pay | Admitting: Family

## 2019-09-14 ENCOUNTER — Other Ambulatory Visit: Payer: Self-pay | Admitting: Family

## 2019-09-14 DIAGNOSIS — R635 Abnormal weight gain: Secondary | ICD-10-CM

## 2019-09-14 NOTE — Telephone Encounter (Signed)
Called patient to ask if they wanted to proceed with the Saxenda Appeal. Heather [name] said that it was fine and we could try another medication.

## 2019-09-14 NOTE — Telephone Encounter (Signed)
Prior Authorization for Saxenda 18MG /3ML has been sent to plan and Denied by CoverMyMeds. Patient called and notified.

## 2019-09-14 NOTE — Telephone Encounter (Signed)
Let me know results of PA

## 2019-09-15 ENCOUNTER — Other Ambulatory Visit: Payer: Self-pay

## 2019-09-15 DIAGNOSIS — E785 Hyperlipidemia, unspecified: Secondary | ICD-10-CM

## 2019-09-15 MED ORDER — ROSUVASTATIN CALCIUM 20 MG PO TABS: 20.0000 mg | ORAL_TABLET | Freq: Every day | ORAL | 1 refills | Status: DC

## 2019-09-16 MED ORDER — LIRAGLUTIDE 18 MG/3ML ~~LOC~~ SOPN: PEN_INJECTOR | SUBCUTANEOUS | 12 refills | Status: DC

## 2019-09-16 NOTE — Telephone Encounter (Signed)
Patient verbalized understanding and had no further questions.  

## 2019-09-16 NOTE — Telephone Encounter (Signed)
Call pt I sent in victoza with instructions to increase from 0.6 mg to 1.2mg  Ensure she has f/u with me 3 mos

## 2019-09-16 NOTE — Addendum Note (Signed)
Addended by: Allegra Grana on: 09/16/2019 12:54 PM   Modules accepted: Orders

## 2019-09-20 ENCOUNTER — Other Ambulatory Visit: Payer: Self-pay | Admitting: Family

## 2019-09-20 DIAGNOSIS — F4010 Social phobia, unspecified: Secondary | ICD-10-CM

## 2019-09-20 DIAGNOSIS — I10 Essential (primary) hypertension: Secondary | ICD-10-CM

## 2019-09-20 DIAGNOSIS — G43009 Migraine without aura, not intractable, without status migrainosus: Secondary | ICD-10-CM

## 2019-10-20 ENCOUNTER — Other Ambulatory Visit: Payer: Self-pay

## 2019-10-20 ENCOUNTER — Telehealth (INDEPENDENT_AMBULATORY_CARE_PROVIDER_SITE_OTHER): Payer: BC Managed Care – PPO | Admitting: Psychiatry

## 2019-10-20 ENCOUNTER — Encounter: Payer: Self-pay | Admitting: Psychiatry

## 2019-10-20 DIAGNOSIS — F4010 Social phobia, unspecified: Secondary | ICD-10-CM

## 2019-10-20 DIAGNOSIS — F5101 Primary insomnia: Secondary | ICD-10-CM

## 2019-10-20 MED ORDER — DOXEPIN HCL 10 MG PO CAPS: 10.0000 mg | ORAL_CAPSULE | Freq: Every evening | ORAL | 1 refills | Status: DC | PRN

## 2019-10-20 NOTE — Progress Notes (Signed)
Provider Location : [facility] Patient Location : Home  Participants: Patient , Provider  Virtual Visit via Video Note  I connected with Heather [name] on 10/20/19 at  2:20 PM EDT by a video enabled telemedicine application and verified that I am speaking with the correct person using two identifiers.   I discussed the limitations of evaluation and management by telemedicine and the availability of in person appointments. The patient expressed understanding and agreed to proceed.     I discussed the assessment and treatment plan with the patient. The patient was provided an opportunity to ask questions and all were answered. The patient agreed with the plan and demonstrated an understanding of the instructions.   The patient was advised to call back or seek an in-person evaluation if the symptoms worsen or if the condition fails to improve as anticipated.   BH MD OP Progress Note  10/20/2019 3:51 PM Heather [name]  MRN:  [id number]  Chief Complaint:  Chief Complaint    Follow-up     HPI: Heather [name] is a 44 year old Caucasian female, employed, married, lives in [place], has a history of social anxiety disorder, primary insomnia, migraine headaches, hypertension was evaluated by telemedicine today.  Patient today reports she is currently doing well with regards to her anxiety.  She denies any significant anxiety attacks.  She reports she did not do well on the Rozerem.  She could not sleep on the same.  She had stopped taking it and went back on doxepin.  She reports the doxepin seems to be helpful.  She goes to bed at 9:30 PM and wakes up at 5:30 AM.  She reports her work has been stressful lately.  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  She reports she has brought this issue to the board however nothing has been done yet.  She reports by the time she reaches back home at the end of the day she is exhausted and that could be also  helpful with her sleep at night.  Patient denies any side effects to her medications.  Patient reports she is currently on Victoza and on her diet.  She reports she has lost a lot of weight.  She is excited about that.  Patient denies any suicidality, homicidality or perceptual disturbances.  Patient denies any other concerns today.  Visit Diagnosis:    ICD-10-CM   1. Primary insomnia  F51.01 doxepin (SINEQUAN) 10 MG capsule  2. Social anxiety disorder  F40.10 doxepin (SINEQUAN) 10 MG capsule    Past Psychiatric History: I have reviewed past psychiatric history from my progress note on 11/28/2018  Past Medical History:  Past Medical History:  Diagnosis Date   Hyperlipidemia    Hypertension    Nail fungus 06.14.2014    Past Surgical History:  Procedure Laterality Date   VAGINAL DELIVERY      Family Psychiatric History: I have reviewed family psychiatric history from my progress note on 11/28/2018  Family History:  Family History  Problem Relation Age of Onset   Hypertension Mother    Insomnia Mother    Heart disease Father    Heart disease Maternal Grandmother    Heart disease Maternal Grandfather    CVA Paternal Grandmother    Breast cancer Neg Hx    Allergic rhinitis Neg Hx    Eczema Neg Hx    Asthma Neg Hx    Urticaria Neg Hx    Thyroid cancer Neg Hx  Social History: I have reviewed social history from my progress note on 11/28/2018 Social History   Socioeconomic History   Marital status: Married    Spouse name: Not on file   Number of children: 1   Years of education: Not on file   Highest education level: Not on file  Occupational History   Not on file  Tobacco Use   Smoking status: Never Smoker   Smokeless tobacco: Never Used  Vaping Use   Vaping Use: Never used  Substance and Sexual Activity   Alcohol use: No   Drug use: No   Sexual activity: Yes    Partners: Male    Birth control/protection: Surgical  Other  Topics Concern   Not on file  Social History Narrative   Lives with daughter and husband in [place]. Works as Estate manager/land agent.   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    Social Determinants of Health   Financial Resource Strain:    Difficulty of Paying Living Expenses: Not on file  Food Insecurity:    Worried About Programme researcher, broadcasting/film/video in the Last Year: Not on file   [employer] of Food in the Last Year: Not on file  Transportation Needs:    Lack of Transportation (Medical): Not on file   Lack of Transportation (Non-Medical): Not on file  Physical Activity:    Days of Exercise per Week: Not on file   Minutes of Exercise per Session: Not on file  Stress:    Feeling of Stress : Not on file  Social Connections:    Frequency of Communication with Friends and Family: Not on file   Frequency of Social Gatherings with Friends and Family: Not on file   Attends Religious Services: Not on file   Active Member of Clubs or Organizations: Not on file   Attends Banker Meetings: Not on file   Marital Status: Not on file    Allergies:  Allergies  Allergen Reactions   Gluten Meal Diarrhea    Metabolic Disorder Labs: Lab Results  Component Value Date   HGBA1C 5.7 09/10/2019   No results found for: PROLACTIN Lab Results  Component Value Date   CHOL 307 (H) 09/10/2019   TRIG 68.0 09/10/2019   HDL 64.10 09/10/2019   CHOLHDL 5 09/10/2019   VLDL 13.6 09/10/2019   LDLCALC 230 (H) 09/10/2019   LDLCALC 177 (H) 10/22/2018   Lab Results  Component Value Date   TSH 1.48 09/10/2019   TSH 1.66 10/22/2018    Therapeutic Level Labs: No results found for: LITHIUM No results found for: VALPROATE No components found for:  CBMZ  Current Medications: Current Outpatient Medications  Medication Sig Dispense Refill   Diclofenac Sodium (PENNSAID) 2 % SOLN Place onto the skin.     doxepin (SINEQUAN) 10 MG capsule Take 1-2 capsules (10-20 mg total) by mouth at bedtime as needed. For sleep  180 capsule 1   hydrochlorothiazide (HYDRODIURIL) 25 MG tablet Take 1 tablet by mouth once daily 90 tablet 0   ipratropium (ATROVENT) 0.06 % nasal spray Place 2 sprays into both nostrils 4 (four) times daily. 15 mL 2   liraglutide (VICTOZA) 18 MG/3ML SOPN Start 0.6 mg [place] qd x 1 week, then 1.2 mg [place] qd thereafter 6 mL 12   metoprolol succinate (TOPROL-XL) 100 MG 24 hr tablet TAKE 1 TABLET BY MOUTH ONCE DAILY WITH  OR  IMMEDIATELY  FOLLOWING  A  MEAL 90 tablet 0   metroNIDAZOLE (METROGEL) 0.75 % gel APPLY TOPICALLY  TO AFFECTED AREA ONCE DAILY.     rizatriptan (MAXALT) 10 MG tablet TAKE 1 TABLET BY MOUTH AS NEEDED FOR MIGRAINE. MAY REPEAT IN 2 HOURS IF NEEDED 10 tablet 5   rosuvastatin (CRESTOR) 20 MG tablet Take 1 tablet (20 mg total) by mouth daily. 90 tablet 1   sertraline (ZOLOFT) 100 MG tablet Take 1 tablet by mouth once daily 90 tablet 0   XHANCE 93 MCG/ACT EXHU      No current facility-administered medications for this visit.     Musculoskeletal: Strength & Muscle Tone: UTA Gait & Station: normal Patient leans: N/A  Psychiatric Specialty Exam: Review of Systems  Psychiatric/Behavioral: Negative for agitation, behavioral problems, confusion, decreased concentration, dysphoric mood, hallucinations, self-injury, sleep disturbance and suicidal ideas. The patient is not nervous/anxious and is not hyperactive.   All other systems reviewed and are negative.   There were no vitals taken for this visit.There is no height or weight on file to calculate BMI.  General Appearance: Casual  Eye Contact:  Fair  Speech:  Clear and Coherent  Volume:  Normal  Mood:  Euthymic  Affect:  Congruent  Thought Process:  Goal Directed and Descriptions of Associations: Intact  Orientation:  Full (Time, Place, and Person)  Thought Content: Logical   Suicidal Thoughts:  No  Homicidal Thoughts:  No  Memory:  Immediate;   Fair Recent;   Fair Remote;   Fair  Judgement:  Fair  Insight:  Fair   Psychomotor Activity:  Normal  Concentration:  Concentration: Fair and Attention Span: Fair  Recall:  [employer] of Knowledge: Fair  Language: Fair  Akathisia:  No  Handed:  Right  AIMS (if indicated;uta  Assets:  Communication Skills Desire for Improvement Housing Social Support  ADL's:  Intact  Cognition: WNL  Sleep:  Fair   Screenings: PHQ2-9     Office Visit from 09/10/2019 in [facility] Office Visit from 11/03/2018 in [facility] Office Visit from 09/26/2016 in [facility] Office Visit from 03/12/2016 in [facility]  PHQ-2 Total Score 0 0 0 0       Assessment and Plan: Heather [name] is a 44 year old Caucasian female who has a history of primary insomnia, social anxiety disorder, hypertension, migraine headaches was evaluated by telemedicine today.  Patient is biologically predisposed given her family history.  Patient is currently making progress with regards to her sleep.  Plan as noted below.  Plan Primary insomnia-stable Restart doxepin 10 to 20 mg p.o. nightly as needed Discontinue Rozerem for lack of benefit.  Social anxiety disorder-stable Zoloft 100 mg p.o. daily  Follow-up in clinic in 8 to 9 weeks or sooner if needed.  I have spent atleast 20 minutes face to face with patient today. More than 50 % of the time was spent for preparing to see the patient ( e.g., review of test, records ),ordering medications and test ,psychoeducation and supportive psychotherapy and care coordination,as well as documenting clinical information in electronic health record. This note was generated in part or whole with voice recognition software. Voice recognition is usually quite accurate but there are transcription errors that can and very often do occur. I apologize for any typographical errors that were not detected and corrected.        Jomarie Longs, MD 10/20/2019, 3:51 PM

## 2019-10-23 ENCOUNTER — Telehealth: Payer: Self-pay

## 2019-10-23 NOTE — Telephone Encounter (Signed)
Returned call to patient.  She reports she is currently struggling at her work due to work-related stressors.  She reports she had a meltdown at her work yesterday and she is planning to take some time off since the work-related stressors are taking a toll on her mental health.  Discussed that she can be taken out of work for a few days.  Also discussed to start psychotherapy sessions in the meantime.  She will call back on Tuesday to schedule appointment with writer as well as the therapist.

## 2019-10-23 NOTE — Telephone Encounter (Signed)
pt called wants to speak with you .  she stated that she was at work last time you guys talked and she was not able to tell you everything.  but she is having a stressful time at work and she was almost going to quit her job of over 20 years yesterday because of things going on at work.

## 2019-10-27 ENCOUNTER — Other Ambulatory Visit (INDEPENDENT_AMBULATORY_CARE_PROVIDER_SITE_OTHER): Payer: BC Managed Care – PPO

## 2019-10-27 ENCOUNTER — Other Ambulatory Visit: Payer: BC Managed Care – PPO

## 2019-10-27 ENCOUNTER — Other Ambulatory Visit: Payer: Self-pay

## 2019-10-27 DIAGNOSIS — E785 Hyperlipidemia, unspecified: Secondary | ICD-10-CM | POA: Diagnosis not present

## 2019-10-28 ENCOUNTER — Other Ambulatory Visit: Payer: Self-pay | Admitting: Family

## 2019-10-28 DIAGNOSIS — I10 Essential (primary) hypertension: Secondary | ICD-10-CM

## 2019-10-28 LAB — COMPREHENSIVE METABOLIC PANEL
ALT: 13 U/L (ref 0–35)
AST: 18 U/L (ref 0–37)
Albumin: 4.5 g/dL (ref 3.5–5.2)
Alkaline Phosphatase: 54 U/L (ref 39–117)
BUN: 9 mg/dL (ref 6–23)
CO2: 30 mEq/L (ref 19–32)
Calcium: 9.4 mg/dL (ref 8.4–10.5)
Chloride: 99 mEq/L (ref 96–112)
Creatinine, Ser: 0.76 mg/dL (ref 0.40–1.20)
GFR: 82.08 mL/min (ref >60.00)
Glucose, Bld: 77 mg/dL (ref 70–99)
Potassium: 3.1 mEq/L — ABNORMAL LOW (ref 3.5–5.1)
Sodium: 137 mEq/L (ref 135–145)
Total Bilirubin: 0.7 mg/dL (ref 0.2–1.2)
Total Protein: 6.8 g/dL (ref 6.0–8.3)

## 2019-10-28 MED ORDER — POTASSIUM CHLORIDE ER 10 MEQ PO TBCR: 10.0000 meq | EXTENDED_RELEASE_TABLET | Freq: Every day | ORAL | 1 refills | Status: DC

## 2019-10-29 ENCOUNTER — Other Ambulatory Visit: Payer: Self-pay | Admitting: Family

## 2019-10-29 DIAGNOSIS — G43009 Migraine without aura, not intractable, without status migrainosus: Secondary | ICD-10-CM

## 2019-11-02 ENCOUNTER — Encounter: Payer: Self-pay | Admitting: Psychiatry

## 2019-11-02 ENCOUNTER — Ambulatory Visit (INDEPENDENT_AMBULATORY_CARE_PROVIDER_SITE_OTHER): Payer: BC Managed Care – PPO | Admitting: Licensed Clinical Social Worker

## 2019-11-02 ENCOUNTER — Other Ambulatory Visit: Payer: Self-pay

## 2019-11-02 ENCOUNTER — Telehealth (INDEPENDENT_AMBULATORY_CARE_PROVIDER_SITE_OTHER): Payer: BC Managed Care – PPO | Admitting: Psychiatry

## 2019-11-02 DIAGNOSIS — F4010 Social phobia, unspecified: Secondary | ICD-10-CM | POA: Diagnosis not present

## 2019-11-02 DIAGNOSIS — F5101 Primary insomnia: Secondary | ICD-10-CM | POA: Diagnosis not present

## 2019-11-02 DIAGNOSIS — F4323 Adjustment disorder with mixed anxiety and depressed mood: Secondary | ICD-10-CM | POA: Diagnosis not present

## 2019-11-02 NOTE — Progress Notes (Signed)
Provider Location : [facility] Patient Location : Home  Participants: Patient , Provider  Virtual Visit via Video Note  I connected with Heather [name] on 11/02/19 at  2:15 PM EDT by a video enabled telemedicine application and verified that I am speaking with the correct person using two identifiers.   I discussed the limitations of evaluation and management by telemedicine and the availability of in person appointments. The patient expressed understanding and agreed to proceed.   I discussed the assessment and treatment plan with the patient. The patient was provided an opportunity to ask questions and all were answered. The patient agreed with the plan and demonstrated an understanding of the instructions.   The patient was advised to call back or seek an in-person evaluation if the symptoms worsen or if the condition fails to improve as anticipated.  BH MD OP Progress Note  11/02/2019 2:31 PM Heather [name]  MRN:  [id number]  Chief Complaint:  Chief Complaint    Follow-up     HPI: Heather [name] is a 44 year old Caucasian female, employed, married, lives in [place], has a history of social anxiety disorder, primary insomnia, migraine headaches, hypertension was evaluated by telemedicine today.  Patient reports she is currently taking a break from school since she was unable to cope with the psychosocial stressors at work.  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  She also reports that it became difficult for her to manage and since she wanted to prevent her anxiety symptoms from decompensating more she decided to take a break.  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  She reports she has also started psychotherapy sessions and is motivated to stay in therapy.  She  reports her Zoloft is beneficial.  She does not want to make more changes with her medications and wants to try the CBT first.  She reports sleep continues to be good on the doxepin.  She denies any suicidality, homicidality or perceptual disturbances.  Patient denies any other concerns today.  Visit Diagnosis:    ICD-10-CM   1. Primary insomnia  F51.01   2. Social anxiety disorder  F40.10   3. Adjustment disorder with mixed anxiety and depressed mood  F43.23     Past Psychiatric History: I have reviewed past psychiatric history from my progress note on 11/28/2018  Past Medical History:  Past Medical History:  Diagnosis Date  . Hyperlipidemia   . Hypertension   . Nail fungus 06.14.2014    Past Surgical History:  Procedure Laterality Date  . VAGINAL DELIVERY      Family Psychiatric History: Reviewed family psychiatric history from my progress note on 11/28/2018  Family History:  Family History  Problem Relation Age of Onset  . Hypertension Mother   . Insomnia Mother   . Heart disease Father   . Heart disease Maternal Grandmother   . Heart disease Maternal Grandfather   . CVA Paternal Grandmother   . Breast cancer Neg Hx   . Allergic rhinitis Neg Hx   . Eczema Neg Hx   . Asthma Neg Hx   . Urticaria Neg Hx   . Thyroid cancer Neg Hx     Social History: Reviewed social history from my progress note on 11/28/2018 Social History  Socioeconomic History  . Marital status: Married    Spouse name: Not on file  . Number of children: 1  . Years of education: Not on file  . Highest education level: Not on file  Occupational History  . Not on file  Tobacco Use  . Smoking status: Never Smoker  . Smokeless tobacco: Never Used  Vaping Use  . Vaping Use: Never used  Substance and Sexual Activity  . Alcohol use: No  . Drug use: No  . Sexual activity: Yes    Partners: Male    Birth control/protection: Surgical  Other Topics Concern  . Not on file  Social History  Narrative   Lives with daughter and husband in [place]. Works as Estate manager/land agentpreschool TA.   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    Social Determinants of Health   Financial Resource Strain:   . Difficulty of Paying Living Expenses: Not on file  Food Insecurity:   . Worried About Programme researcher, broadcasting/film/videounning Out of Food in the Last Year: Not on file  . Ran Out of Food in the Last Year: Not on file  Transportation Needs:   . Lack of Transportation (Medical): Not on file  . Lack of Transportation (Non-Medical): Not on file  Physical Activity:   . Days of Exercise per Week: Not on file  . Minutes of Exercise per Session: Not on file  Stress:   . Feeling of Stress : Not on file  Social Connections:   . Frequency of Communication with Friends and Family: Not on file  . Frequency of Social Gatherings with Friends and Family: Not on file  . Attends Religious Services: Not on file  . Active Member of Clubs or Organizations: Not on file  . Attends BankerClub or Organization Meetings: Not on file  . Marital Status: Not on file    Allergies:  Allergies  Allergen Reactions  . Gluten Meal Diarrhea    Metabolic Disorder Labs: Lab Results  Component Value Date   HGBA1C 5.7 09/10/2019   No results found for: PROLACTIN Lab Results  Component Value Date   CHOL 307 (H) 09/10/2019   TRIG 68.0 09/10/2019   HDL 64.10 09/10/2019   CHOLHDL 5 09/10/2019   VLDL 13.6 09/10/2019   LDLCALC 230 (H) 09/10/2019   LDLCALC 177 (H) 10/22/2018   Lab Results  Component Value Date   TSH 1.48 09/10/2019   TSH 1.66 10/22/2018    Therapeutic Level Labs: No results found for: LITHIUM No results found for: VALPROATE No components found for:  CBMZ  Current Medications: Current Outpatient Medications  Medication Sig Dispense Refill  . Diclofenac Sodium (PENNSAID) 2 % SOLN Place onto the skin.    Marland Kitchen. doxepin (SINEQUAN) 10 MG capsule Take 1-2 capsules (10-20 mg total) by mouth at bedtime as needed. For sleep 180 capsule 1  . hydrochlorothiazide  (HYDRODIURIL) 25 MG tablet Take 1 tablet by mouth once daily 90 tablet 0  . ipratropium (ATROVENT) 0.06 % nasal spray Place 2 sprays into both nostrils 4 (four) times daily. 15 mL 2  . liraglutide (VICTOZA) 18 MG/3ML SOPN Start 0.6 mg [place] qd x 1 week, then 1.2 mg [place] qd thereafter 6 mL 12  . metoprolol succinate (TOPROL-XL) 100 MG 24 hr tablet TAKE 1 TABLET BY MOUTH ONCE DAILY WITH  OR  IMMEDIATELY  FOLLOWING  A  MEAL 90 tablet 0  . metroNIDAZOLE (METROGEL) 0.75 % gel APPLY TOPICALLY TO AFFECTED AREA ONCE DAILY.    Marland Kitchen. potassium chloride (KLOR-CON) 10 MEQ tablet Take 1  tablet (10 mEq total) by mouth daily. 90 tablet 1  . rizatriptan (MAXALT) 10 MG tablet TAKE ONE TABLET BY MOUTH AS NEEDED FOR MIGRAINE. MAY REPEAT IN TWO HOURS IF NEEDED 10 tablet 0  . rosuvastatin (CRESTOR) 20 MG tablet Take 1 tablet (20 mg total) by mouth daily. 90 tablet 1  . sertraline (ZOLOFT) 100 MG tablet Take 1 tablet by mouth once daily 90 tablet 0  . XHANCE 93 MCG/ACT EXHU      No current facility-administered medications for this visit.     Musculoskeletal: Strength & Muscle Tone: UTA Gait & Station: normal Patient leans: N/A  Psychiatric Specialty Exam: Review of Systems  Psychiatric/Behavioral: The patient is nervous/anxious.   All other systems reviewed and are negative.   There were no vitals taken for this visit.There is no height or weight on file to calculate BMI.  General Appearance: Casual  Eye Contact:  Fair  Speech:  Normal Rate  Volume:  Normal  Mood:  Anxious  Affect:  Congruent  Thought Process:  Goal Directed and Descriptions of Associations: Intact  Orientation:  Full (Time, Place, and Person)  Thought Content: Logical   Suicidal Thoughts:  No  Homicidal Thoughts:  No  Memory:  Immediate;   Fair Recent;   Fair Remote;   Fair  Judgement:  Fair  Insight:  Fair  Psychomotor Activity:  Normal  Concentration:  Concentration: Fair and Attention Span: Fair  Recall:  [employer] of Knowledge:  Fair  Language: Fair  Akathisia:  No  Handed:  Right  AIMS (if indicated): UTA  Assets:  Communication Skills Desire for Improvement Housing Social Support  ADL's:  Intact  Cognition: WNL  Sleep:  Fair   Screenings: GAD-7     Counselor from 11/02/2019 in [facility]  Total GAD-7 Score 15    PHQ2-9     Counselor from 11/02/2019 in [facility] Office Visit from 09/10/2019 in [facility] Office Visit from 11/03/2018 in [facility] Office Visit from 09/26/2016 in [facility] Office Visit from 03/12/2016 in [facility]  PHQ-2 Total Score 3 0 0 0 0  PHQ-9 Total Score 11 -- -- -- --       Assessment and Plan: Heather [name] is a 45 year old Caucasian female who has a history of primary insomnia, social anxiety disorder, hypertension, migraine headaches was evaluated by telemedicine today.  She is biologically predisposed given her family history.  Patient is currently having situational stressors, work-related problems.  She will continue to benefit from psychotherapy sessions and medication management.  Plan as noted below.  Plan Primary insomnia-stable Doxepin 10 to 20 mg p.o. nightly as needed  Social anxiety disorder-stable Zoloft 100 mg p.o. daily  Adjustment disorder-unstable Patient has started CBT with our therapist Ms. Christina Hussami Will continue to monitor her closely for further medication changes.  Follow-up in clinic in 3 to 4 weeks or sooner if needed.  I have spent atleast 20 minutes face to face with patient today. More than 50 % of the time was spent for preparing to see the patient ( e.g., review of test, records ),ordering medications and test ,psychoeducation and supportive psychotherapy and care coordination,as well as documenting clinical information in electronic health record. This note was generated in part or whole  with voice recognition software. Voice recognition is usually quite accurate but there are transcription errors that can and very often do occur. I apologize  for any typographical errors that were not detected and corrected.       Jomarie Longs, MD 11/03/2019, 8:24 AM

## 2019-11-02 NOTE — Progress Notes (Signed)
Virtual Visit via Video Note  I connected with Heather [name] on 11/02/19 at  9:00 AM EDT by a video enabled telemedicine application and verified that I am speaking with the correct person using two identifiers.  Location: Patient: home Provider: [facility]   I discussed the limitations of evaluation and management by telemedicine and the availability of in person appointments. The patient expressed understanding and agreed to proceed.   I discussed the assessment and treatment plan with the patient. The patient was provided an opportunity to ask questions and all were answered. The patient agreed with the plan and demonstrated an understanding of the instructions.   The patient was advised to call back or seek an in-person evaluation if the symptoms worsen or if the condition fails to improve as anticipated.  I provided 40 minutes of non-face-to-face time during this encounter.   Tequilla Cousineau R Calypso Hagarty, LCSW    THERAPIST PROGRESS NOTE  Session Time: 9:08-9:48  Participation Level: Active  Behavioral Response: NAAlertAnxious  Type of Therapy: Individual Therapy  Treatment Goals addressed: Anxiety  Interventions: CBT and Supportive  Summary: Heather [name] is a 44 y.o. female who presents with symptoms consistent with adjustment disorder diagnosis.  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   Allowed pt to express thoughts and feelings about situation, and what interventions have worked, and what have not been helpful. Pt reports that she is currently on vacation leave at work and may be seeking medical leave to keep her out of the classroom at this point in time. 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 Pt feels very  defeated and hesitates to return to her job right now.   Encouraged pt to focus on self care and life balance right now. Reviewed importance of overall self care, physical activity, social engagement, cognitive stimulating activities, and overall life balance.  Pt reports that she has psychiatrist appt tomorrow.  Suicidal/Homicidal: No  SI, HI, or AVH reported at time of session.  Therapist Response: Emerly reports that the recent situation/crisis at work is overwhelming her resources. Progress is needed to get pt to an emotionally healthy state of mind to transition back to the traditional work environment. Continue OPT.  Plan: Return again in 2 weeks. Ongoing treatment plan is to improve mood, improve management of stress/anxiety, and increase self care activities.  Diagnosis: Axis I: Adjustment Disorder with Mixed Emotional Features    Axis II: No diagnosis    Ernest Haber Adri Schloss, LCSW 11/02/2019

## 2019-11-02 NOTE — Patient Instructions (Signed)
Caring for Your Mental Health Mental health is emotional, psychological, and social well-being. Mental health is just as important as physical health. In fact, mental and physical health are connected, and you need both to be healthy. Some signs of good mental health (well-being) include:  Being able to attend to tasks at home, school, or work.  Being able to manage stress and emotions.  Practicing self-care, which may include: ? A regular exercise pattern. ? A reasonably healthy diet. ? Supportive and trusting relationships. ? The ability to relax and calm yourself (self-calm).  Having pleasurable hobbies and activities to do.  Believing that you have meaning and purpose in your life.  Recovering and adjusting after facing challenges (resilience). You can take steps to build or strengthen these mentally healthy behaviors. There are resources and support to help you with this. Why is caring for mental health important? Caring for your mental health is a big part of staying healthy. Everyone has times when feelings, thoughts, or situations feel overwhelming. Mental health means having the skills to manage what feels overwhelming. If this sense of being overwhelmed persists, however, you might need some help. If you have some of the following signs, you may need to take better care of your mental health or seek help from a health care provider or mental health professional:  Problems with energy or focus.  Changes in eating habits.  Problems sleeping, such as sleeping too much or not enough.  Emotional distress, such as anger, sadness, depression, or anxiety.  Major changes in your relationships.  Losing interest in life or activities that you used to enjoy. If you have any of these symptoms on most days for 2 weeks or longer:  Talk with a close friend or family member about how you are feeling.  Contact your health care provider to discuss your symptoms.  Consider working with a  mental health professional. Your health care provider, family, or friends may be able to recommend a therapist. What can I do to promote emotional and mental health? Managing emotions  Learn to identify emotions and deal with them. Recognizing your emotions is the first step in learning to deal with them.  Practice ways to appropriately express feelings. Remember that you can control your feelings. They do not control you.  Practice stress management techniques, such as: ? Relaxation techniques, like breathing or muscle relaxation exercises. ? Exercise. Regular activity can lower your stress level. ? Changing what you can change and accepting what you cannot change.  Build up your resilience so that you can recover and adjust after big problems or challenges. Practice resilient behaviors and attitudes: ? Set and focus on long-term goals. ? Develop and maintain healthy, supportive relationships. ? Learn to accept change and make the best of the situation. ? Take care of yourself physically by eating a healthy diet, getting plenty of sleep, and exercising regularly. ? Develop self-awareness. Ask others to give feedback about how they see you. ? Practice mindfulness meditation to help you stay calm when dealing with daily challenges. ? Learn to respond to situations in healthy ways, rather than reacting with your emotions. ? Keep a positive attitude, and believe in yourself. Your view of yourself affects your mental health. ? Develop your listening and empathy skills. These will help you deal with difficult situations and communications.  Remember that emotions can be used as a good source of communication and are a great source of energy. Try to laugh and find humor in life.   Sleeping  Get the right amount and quality of sleep. Sleep has a big impact on physical and mental health. To improve your sleep: ? Go to bed and wake up around the same time every day. ? Limit screen time before  bedtime. This includes the use of your cell phone, TV, computer, and tablet. ? Keep your bedroom dark and cool. Activity   Exercise or do some physical activity regularly. This helps: ? Keep your body strong, especially during times of stress. ? Get rid of chemicals in your body (hormones) that build up when you are stressed. ? Build up your resilience. Eating and drinking   Eat a healthy diet that includes whole grains, vegetables, fresh fruits, and lean proteins. If you have questions about what foods are best for you, ask your health care provider.  Try not to turn to sweet, salty, or otherwise unhealthy foods when you are tired or unhappy. This can lead to unwanted weight gain and is not a healthy way to cope with emotions. Where to find more information You can find more information about how to care for your mental health from:  National Alliance on Mental Illness (NAMI): www.nami.org  National Institute of Mental Health: www.nimh.nih.gov  Centers for Disease Control and Prevention: www.cdc.gov/hrqol/wellbeing.htm Contact a health care provider if:  You lose interest in being with others or you do not want to leave the house.  You have a hard time completing your normal activities or you have less energy than normal.  You cannot stay focused or you have problems with memory.  You feel that your senses are heightened, and this makes you upset or concerned.  You feel nervous or have rapid mood changes.  You are sleeping or eating more or less than normal.  You question reality or you show odd behavior that disturbs you or others. Get help right away if:  You have thoughts about hurting yourself or others. If you ever feel like you may hurt yourself or others, or have thoughts about taking your own life, get help right away. You can go to your nearest emergency department or call:  Your local emergency services (911 in the [place]  A suicide crisis helpline, such as the  National Suicide Prevention Lifeline at 1-800-273-8255. This is open 24 hours a day. Summary  Mental health is not just the absence of mental illness. It involves understanding your emotions and behaviors, and taking steps to cope with them in a healthy way.  If you have symptoms of mental or emotional distress, get help from family, friends, a health care provider, or a mental health professional.  Practice good mental health behaviors such as stress management skills, self-calming skills, exercise, and healthy sleeping and eating. This information is not intended to replace advice given to you by your health care provider. Make sure you discuss any questions you have with your health care provider. Document Revised: 01/18/2017 Document Reviewed: 06/19/2016 Elsevier Patient Education  2020 Elsevier Inc.  

## 2019-11-11 ENCOUNTER — Other Ambulatory Visit: Payer: Self-pay

## 2019-11-11 ENCOUNTER — Other Ambulatory Visit (INDEPENDENT_AMBULATORY_CARE_PROVIDER_SITE_OTHER): Payer: BC Managed Care – PPO

## 2019-11-11 DIAGNOSIS — I10 Essential (primary) hypertension: Secondary | ICD-10-CM | POA: Diagnosis not present

## 2019-11-12 ENCOUNTER — Other Ambulatory Visit: Payer: Self-pay | Admitting: Family

## 2019-11-12 DIAGNOSIS — I10 Essential (primary) hypertension: Secondary | ICD-10-CM

## 2019-11-12 LAB — BASIC METABOLIC PANEL
BUN: 12 mg/dL (ref 6–23)
CO2: 28 mEq/L (ref 19–32)
Calcium: 9.5 mg/dL (ref 8.4–10.5)
Chloride: 101 mEq/L (ref 96–112)
Creatinine, Ser: 0.84 mg/dL (ref 0.40–1.20)
GFR: 73.11 mL/min (ref >60.00)
Glucose, Bld: 86 mg/dL (ref 70–99)
Potassium: 3.4 mEq/L — ABNORMAL LOW (ref 3.5–5.1)
Sodium: 138 mEq/L (ref 135–145)

## 2019-11-12 MED ORDER — POTASSIUM CHLORIDE ER 10 MEQ PO TBCR: EXTENDED_RELEASE_TABLET | ORAL | 2 refills | Status: DC

## 2019-11-13 ENCOUNTER — Other Ambulatory Visit: Payer: Self-pay

## 2019-11-13 DIAGNOSIS — E876 Hypokalemia: Secondary | ICD-10-CM

## 2019-11-25 ENCOUNTER — Other Ambulatory Visit: Payer: BC Managed Care – PPO

## 2019-11-26 ENCOUNTER — Ambulatory Visit: Payer: BC Managed Care – PPO | Admitting: Licensed Clinical Social Worker

## 2019-11-27 ENCOUNTER — Other Ambulatory Visit: Payer: Self-pay

## 2019-11-27 ENCOUNTER — Ambulatory Visit (INDEPENDENT_AMBULATORY_CARE_PROVIDER_SITE_OTHER): Payer: BC Managed Care – PPO | Admitting: Licensed Clinical Social Worker

## 2019-11-27 DIAGNOSIS — F4323 Adjustment disorder with mixed anxiety and depressed mood: Secondary | ICD-10-CM | POA: Diagnosis not present

## 2019-11-27 NOTE — Progress Notes (Signed)
Virtual Visit via Video Note  I connected with Heather [name] on 11/27/19 at  8:00 AM EDT by a video enabled telemedicine application and verified that I am speaking with the correct person using two identifiers.  Location: Patient: home Provider: remote office [place], [place])   I discussed the limitations of evaluation and management by telemedicine and the availability of in person appointments. The patient expressed understanding and agreed to proceed.   The patient was advised to call back or seek an in-person evaluation if the symptoms worsen or if the condition fails to improve as anticipated.  I provided 15 minutes of non-face-to-face time during this encounter.   Adaora Mchaney R Kirkland Figg, LCSW    THERAPIST PROGRESS NOTE  Session Time: 8:00-8:15a  Participation Level: Active  Behavioral Response: Neat and Well GroomedAlertimproved; pleasant  Type of Therapy: Individual Therapy  Treatment Goals addressed: Coping  Interventions: Solution Focused and Supportive  Summary: Heather [name] is a 44 y.o. female who presents with Heather [name] reports remission of symptoms that initially led her into counseling. Pt had work-related stressors that she feels have been resolved due to changes that have been made in the work environment.  Pt reports that her mood is stable, she is resting well, eating patterns are within normal limits and that she is managing stress/anxiety well.  Pt is extremely happy that previous external stressors has been resolved and wishes to continue counseling on a PRN basis.  Encouraged pt to continue focusing on self-care and life balance.   Suicidal/Homicidal: No  SI, HI, or AVH reported at time of session.  Therapist Response: Heather [name] continues to make good progress with self understanding and self insight.  Developments continue in the areas of occupational functioning and achievement. Pt feels previous crisis situation within work environment has been  resolved.  Plan: Return again PRN.  Diagnosis: Axis I: Adjustment Disorder with Mixed Emotional Features    Axis II: No diagnosis    Ernest Haber Antonina Deziel, LCSW 11/27/2019

## 2019-12-02 ENCOUNTER — Other Ambulatory Visit: Payer: BC Managed Care – PPO

## 2019-12-03 ENCOUNTER — Telehealth: Payer: BC Managed Care – PPO | Admitting: Psychiatry

## 2019-12-11 ENCOUNTER — Encounter: Payer: Self-pay | Admitting: Family

## 2019-12-11 ENCOUNTER — Telehealth (INDEPENDENT_AMBULATORY_CARE_PROVIDER_SITE_OTHER): Payer: BC Managed Care – PPO | Admitting: Family

## 2019-12-11 DIAGNOSIS — F4010 Social phobia, unspecified: Secondary | ICD-10-CM | POA: Diagnosis not present

## 2019-12-11 DIAGNOSIS — G43009 Migraine without aura, not intractable, without status migrainosus: Secondary | ICD-10-CM

## 2019-12-11 DIAGNOSIS — I10 Essential (primary) hypertension: Secondary | ICD-10-CM | POA: Diagnosis not present

## 2019-12-11 DIAGNOSIS — R635 Abnormal weight gain: Secondary | ICD-10-CM | POA: Diagnosis not present

## 2019-12-11 MED ORDER — POTASSIUM CHLORIDE ER 10 MEQ PO TBCR: EXTENDED_RELEASE_TABLET | ORAL | 2 refills | Status: DC

## 2019-12-11 MED ORDER — METOPROLOL SUCCINATE ER 100 MG PO TB24: ORAL_TABLET | ORAL | 1 refills | Status: DC

## 2019-12-11 MED ORDER — HYDROCHLOROTHIAZIDE 25 MG PO TABS: 25.0000 mg | ORAL_TABLET | Freq: Every day | ORAL | 1 refills | Status: DC

## 2019-12-11 NOTE — Assessment & Plan Note (Signed)
Stable. Continue hctz 25mg , metoprolol succinate 100mg  qd. She will remain on potassium chloride MWF and Tues, Thursday, Sat, Sun 10 meq. Pending BMP and if potassium on low end, we will increase to potassium chloride 20mg  QD for compliance and ease for patient.

## 2019-12-11 NOTE — Progress Notes (Signed)
Virtual Visit via Video Note  I connected with@  on 12/11/19 at  2:00 PM EDT by a video enabled telemedicine application and verified that I am speaking with the correct person using two identifiers.  Location patient: home Location provider:work  Persons participating in the virtual visit: patient, provider  I discussed the limitations of evaluation and management by telemedicine and the availability of in person appointments. The patient expressed understanding and agreed to proceed.   HPI: Weight loss of 9lbs and pleased with victoza 1.8 mg qam. She feels however she has plateaud. Her insurance would not cover saxenda.   HA- have improved since last visit.  Using maxalt 10mg  2-3 per month, HA's feel similar to HA's in the past.   HTN- compliant with hctz, metoprolol succinate  Follows with Dr for insomnia, adjustment disorder whom prescribes doxepin, zoloft.   ROS: See pertinent positives and negatives per HPI.    EXAM:  VITALS per patient if applicable: Ht 5\' 3"  (1.6 m)   Wt 140 lb (63.5 kg)   BMI 24.80 kg/m  BP Readings from Last 3 Encounters:  09/10/19 110/70  10/31/18 132/83  04/08/18 112/70   Wt Readings from Last 3 Encounters:  12/11/19 140 lb (63.5 kg)  09/10/19 149 lb 9.6 oz (67.9 kg)  10/31/18 145 lb (65.8 kg)    GENERAL: alert, oriented, appears well and in no acute distress  HEENT: atraumatic, conjunttiva clear, no obvious abnormalities on inspection of external nose and ears  NECK: normal movements of the head and neck  LUNGS: on inspection no signs of respiratory distress, breathing rate appears normal, no obvious gross SOB, gasping or wheezing  CV: no obvious cyanosis  MS: moves all visible extremities without noticeable abnormality  PSYCH/NEURO: pleasant and cooperative, no obvious depression or anxiety, speech and thought processing grossly intact  ASSESSMENT AND PLAN:  Discussed the following assessment and plan:  Problem List  Items Addressed This Visit      Cardiovascular and Mediastinum   Hypertension    Stable. Continue hctz 25mg , metoprolol succinate 100mg  qd. She will remain on potassium chloride 09/12/19 MWF and Tues, Thursday, Sat, Sun 10 meq. Pending BMP and if potassium on low end, we will increase to potassium chloride 20mg  QD for compliance and ease for patient.       Relevant Medications   hydrochlorothiazide (HYDRODIURIL) 25 MG tablet   potassium chloride (KLOR-CON) 10 MEQ tablet   metoprolol succinate (TOPROL-XL) 100 MG 24 hr tablet   Migraine    Stable. Returned to baseline. Patient declines further work up at this time and will let me know if HA's were to worsen in frequency, severity , or change in presentation at any time.       Relevant Medications   hydrochlorothiazide (HYDRODIURIL) 25 MG tablet   metoprolol succinate (TOPROL-XL) 100 MG 24 hr tablet     Other   Social anxiety disorder   Weight gain    Pleased with 10 lb weight loss. She has plateaued  unfortunately on victoza 1.8mg . We will work to see if we can get Saxenda improved.        Other Visit Diagnoses    Essential hypertension       Relevant Medications   hydrochlorothiazide (HYDRODIURIL) 25 MG tablet   potassium chloride (KLOR-CON) 10 MEQ tablet   metoprolol succinate (TOPROL-XL) 100 MG 24 hr tablet      -we discussed possible serious and likely etiologies, options for evaluation and workup, limitations of telemedicine  visit vs in person visit, treatment, treatment risks and precautions. Pt prefers to treat via telemedicine empirically rather then risking or undertaking an in person visit at this moment.  .   I discussed the assessment and treatment plan with the patient. The patient was provided an opportunity to ask questions and all were answered. The patient agreed with the plan and demonstrated an understanding of the instructions.   The patient was advised to call back or seek an in-person evaluation if the  symptoms worsen or if the condition fails to improve as anticipated.   Rennie Plowman, FNP

## 2019-12-11 NOTE — Assessment & Plan Note (Signed)
Pleased with 10 lb weight loss. She has plateaued  unfortunately on victoza 1.8mg . We will work to see if we can get Saxenda improved.

## 2019-12-11 NOTE — Assessment & Plan Note (Signed)
Stable. Returned to baseline. Patient declines further work up at this time and will let me know if HA's were to worsen in frequency, severity , or change in presentation at any time.

## 2019-12-14 NOTE — Progress Notes (Signed)
Just FYI I have resubmitted PA & awaiting response.

## 2019-12-17 NOTE — Progress Notes (Signed)
If you will send in Saxenda for patient. PA was approved! Let me know when sent & I will call patient.

## 2019-12-18 ENCOUNTER — Other Ambulatory Visit: Payer: Self-pay

## 2019-12-18 ENCOUNTER — Other Ambulatory Visit (INDEPENDENT_AMBULATORY_CARE_PROVIDER_SITE_OTHER): Payer: BC Managed Care – PPO

## 2019-12-18 DIAGNOSIS — E876 Hypokalemia: Secondary | ICD-10-CM | POA: Diagnosis not present

## 2019-12-19 LAB — BASIC METABOLIC PANEL
BUN: 21 mg/dL (ref 7–25)
CO2: 25 mmol/L (ref 20–32)
Calcium: 10.2 mg/dL (ref 8.6–10.2)
Chloride: 101 mmol/L (ref 98–110)
Creat: 0.83 mg/dL (ref 0.50–1.10)
Glucose, Bld: 84 mg/dL (ref 65–99)
Potassium: 3.9 mmol/L (ref 3.5–5.3)
Sodium: 137 mmol/L (ref 135–146)

## 2019-12-21 ENCOUNTER — Other Ambulatory Visit: Payer: Self-pay | Admitting: Family

## 2019-12-21 DIAGNOSIS — I10 Essential (primary) hypertension: Secondary | ICD-10-CM

## 2019-12-21 DIAGNOSIS — R635 Abnormal weight gain: Secondary | ICD-10-CM

## 2019-12-21 MED ORDER — SAXENDA 18 MG/3ML ~~LOC~~ SOPN: 1.8000 mg | PEN_INJECTOR | Freq: Every day | SUBCUTANEOUS | 3 refills | Status: DC

## 2019-12-21 MED ORDER — POTASSIUM CHLORIDE ER 20 MEQ PO TBCR: 20.0000 meq | EXTENDED_RELEASE_TABLET | Freq: Every day | ORAL | 1 refills | Status: DC

## 2019-12-24 NOTE — Progress Notes (Signed)
I called & advised patient on how to taper up with medication. I asked that she call us when she needs refill bc script with have to be rewritten for 3mg .

## 2019-12-31 ENCOUNTER — Other Ambulatory Visit: Payer: Self-pay | Admitting: Family

## 2019-12-31 DIAGNOSIS — G43009 Migraine without aura, not intractable, without status migrainosus: Secondary | ICD-10-CM

## 2020-01-05 ENCOUNTER — Encounter: Payer: Self-pay | Admitting: Psychiatry

## 2020-01-05 ENCOUNTER — Other Ambulatory Visit: Payer: Self-pay

## 2020-01-05 ENCOUNTER — Telehealth (INDEPENDENT_AMBULATORY_CARE_PROVIDER_SITE_OTHER): Payer: BC Managed Care – PPO | Admitting: Psychiatry

## 2020-01-05 DIAGNOSIS — F4323 Adjustment disorder with mixed anxiety and depressed mood: Secondary | ICD-10-CM | POA: Diagnosis not present

## 2020-01-05 DIAGNOSIS — F5101 Primary insomnia: Secondary | ICD-10-CM | POA: Diagnosis not present

## 2020-01-05 DIAGNOSIS — F4010 Social phobia, unspecified: Secondary | ICD-10-CM | POA: Diagnosis not present

## 2020-01-05 NOTE — Progress Notes (Signed)
Virtual Visit via Video Note  I connected with Heather [name] on 01/05/20 at  2:20 PM EST by a video enabled telemedicine application and verified that I am speaking with the correct person using two identifiers.  Location Provider Location : [facility] Patient Location : Work  Participants: Patient , Provider I discussed the limitations of evaluation and management by telemedicine and the availability of in person appointments. The patient expressed understanding and agreed to proceed.  I discussed the assessment and treatment plan with the patient. The patient was provided an opportunity to ask questions and all were answered. The patient agreed with the plan and demonstrated an understanding of the instructions.   The patient was advised to call back or seek an in-person evaluation if the symptoms worsen or if the condition fails to improve as anticipated.   BH MD OP Progress Note  01/05/2020 2:40 PM Heather [name]  MRN:  [id number]  Chief Complaint:  Chief Complaint    Follow-up     HPI: Heather [name] is a 44 year old Caucasian female, employed, married, lives in [place], has a history of primary insomnia, social anxiety disorder, adjustment disorder with mixed anxiety and depressed mood was evaluated by telemedicine today.  Patient today reports she is currently doing well.  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  She reports that has made a tremendous change.  She reports her mood symptoms are stable.  She is compliant on Zoloft.  Denies side effects.  She reports sleep is good.  She was able to get a weighted blanket which does help her to sleep.  Patient continues to take the doxepin for sleep.  Denies side effects.  She reports she is currently following up with her therapist only on an as-needed basis since she is doing  well.  She denies any suicidality, homicidality or perceptual disturbances.  Patient denies any other concerns today.  Visit Diagnosis:    ICD-10-CM   1. Primary insomnia  F51.01   2. Social anxiety disorder  F40.10   3. Adjustment disorder with mixed anxiety and depressed mood  F43.23     Past Psychiatric History: I have reviewed past psychiatric history from my progress note on 11/28/2018  Past Medical History:  Past Medical History:  Diagnosis Date  . Hyperlipidemia   . Hypertension   . Nail fungus 06.14.2014    Past Surgical History:  Procedure Laterality Date  . VAGINAL DELIVERY      Family Psychiatric History: I have reviewed family psychiatric history from my progress note on 11/28/2018  Family History:  Family History  Problem Relation Age of Onset  . Hypertension Mother   . Insomnia Mother   . Heart disease Father   . Heart disease Maternal Grandmother   . Heart disease Maternal Grandfather   . CVA Paternal Grandmother   . Breast cancer Neg Hx   . Allergic rhinitis Neg Hx   . Eczema Neg Hx   . Asthma Neg Hx   . Urticaria Neg Hx   . Thyroid cancer Neg Hx     Social History: I have reviewed social history from my progress note on 11/28/2018 Social History   Socioeconomic History  . Marital status: Married    Spouse name: Not on file  . Number of children: 1  . Years of education: Not on file  .  Highest education level: Not on file  Occupational History  . Not on file  Tobacco Use  . Smoking status: Never Smoker  . Smokeless tobacco: Never Used  Vaping Use  . Vaping Use: Never used  Substance and Sexual Activity  . Alcohol use: No  . Drug use: No  . Sexual activity: Yes    Partners: Male    Birth control/protection: Surgical  Other Topics Concern  . Not on file  Social History Narrative   Lives with daughter and husband in [place]. Works as Estate manager/land agent.   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    Social Determinants of Health   Financial Resource Strain:    . Difficulty of Paying Living Expenses: Not on file  Food Insecurity:   . Worried About Programme researcher, broadcasting/film/video in the Last Year: Not on file  . Ran Out of Food in the Last Year: Not on file  Transportation Needs:   . Lack of Transportation (Medical): Not on file  . Lack of Transportation (Non-Medical): Not on file  Physical Activity:   . Days of Exercise per Week: Not on file  . Minutes of Exercise per Session: Not on file  Stress:   . Feeling of Stress : Not on file  Social Connections:   . Frequency of Communication with Friends and Family: Not on file  . Frequency of Social Gatherings with Friends and Family: Not on file  . Attends Religious Services: Not on file  . Active Member of Clubs or Organizations: Not on file  . Attends Banker Meetings: Not on file  . Marital Status: Not on file    Allergies:  Allergies  Allergen Reactions  . Gluten Meal Diarrhea    Metabolic Disorder Labs: Lab Results  Component Value Date   HGBA1C 5.7 09/10/2019   No results found for: PROLACTIN Lab Results  Component Value Date   CHOL 307 (H) 09/10/2019   TRIG 68.0 09/10/2019   HDL 64.10 09/10/2019   CHOLHDL 5 09/10/2019   VLDL 13.6 09/10/2019   LDLCALC 230 (H) 09/10/2019   LDLCALC 177 (H) 10/22/2018   Lab Results  Component Value Date   TSH 1.48 09/10/2019   TSH 1.66 10/22/2018    Therapeutic Level Labs: No results found for: LITHIUM No results found for: VALPROATE No components found for:  CBMZ  Current Medications: Current Outpatient Medications  Medication Sig Dispense Refill  . Diclofenac Sodium (PENNSAID) 2 % SOLN Place onto the skin.    Marland Kitchen doxepin (SINEQUAN) 10 MG capsule Take 1-2 capsules (10-20 mg total) by mouth at bedtime as needed. For sleep 180 capsule 1  . hydrochlorothiazide (HYDRODIURIL) 25 MG tablet Take 1 tablet (25 mg total) by mouth daily. 90 tablet 1  . ipratropium (ATROVENT) 0.06 % nasal spray Place 2 sprays into both nostrils 4 (four) times  daily. 15 mL 2  . KLOR-CON M20 20 MEQ tablet Take 20 mEq by mouth daily.    . Liraglutide -Weight Management (SAXENDA) 18 MG/3ML SOPN Inject 1.8 mg into the skin daily. 3 mL 3  . metoprolol succinate (TOPROL-XL) 100 MG 24 hr tablet TAKE 1 TABLET BY MOUTH ONCE DAILY WITH  OR  IMMEDIATELY  FOLLOWING  A  MEAL 90 tablet 1  . potassium chloride 20 MEQ TBCR Take 20 mEq by mouth daily. 90 tablet 1  . rizatriptan (MAXALT) 10 MG tablet TAKE 1 TABLET BY MOUTH AS NEEDED FOR  MIGRAINE.  MAY  REPEAT  IN  2  HOURS  IF  NEEDED 10 tablet 0  . rosuvastatin (CRESTOR) 20 MG tablet Take 1 tablet (20 mg total) by mouth daily. 90 tablet 1  . sertraline (ZOLOFT) 100 MG tablet Take 1 tablet by mouth once daily 90 tablet 0   No current facility-administered medications for this visit.     Musculoskeletal: Strength & Muscle Tone: UTA Gait & Station: normal Patient leans: N/A  Psychiatric Specialty Exam: Review of Systems  Psychiatric/Behavioral: Negative for agitation, behavioral problems, confusion, decreased concentration, dysphoric mood, hallucinations, self-injury, sleep disturbance and suicidal ideas. The patient is not nervous/anxious and is not hyperactive.   All other systems reviewed and are negative.   There were no vitals taken for this visit.There is no height or weight on file to calculate BMI.  General Appearance: Casual  Eye Contact:  Fair  Speech:  Clear and Coherent  Volume:  Normal  Mood:  Euthymic  Affect:  Congruent  Thought Process:  Goal Directed and Descriptions of Associations: Intact  Orientation:  Full (Time, Place, and Person)  Thought Content: Logical   Suicidal Thoughts:  No  Homicidal Thoughts:  No  Memory:  Immediate;   Fair Recent;   Fair Remote;   Fair  Judgement:  Fair  Insight:  Fair  Psychomotor Activity:  Normal  Concentration:  Concentration: Fair and Attention Span: Fair  Recall:  [employer] of Knowledge: Fair  Language: Fair  Akathisia:  No  Handed:  Right   AIMS (if indicated): UTA  Assets:  Communication Skills Desire for Improvement Housing Social Support  ADL's:  Intact  Cognition: WNL  Sleep:  Fair   Screenings: GAD-7     Counselor from 11/02/2019 in [facility]  Total GAD-7 Score 15    PHQ2-9     Counselor from 11/02/2019 in [facility] Office Visit from 09/10/2019 in [facility] Office Visit from 11/03/2018 in [facility] Office Visit from 09/26/2016 in [facility] Office Visit from 03/12/2016 in [facility] [place]  PHQ-2 Total Score 3 0 0 0 0  PHQ-9 Total Score 11 -- -- -- --       Assessment and Plan: ZANYA LINDO is a 44 year old Caucasian female, has a history of primary insomnia, social anxiety disorder, hypertension, migraine headaches was evaluated by telemedicine today.  Patient is currently doing well on the current medication regimen.  Plan as noted below.  Plan Primary insomnia-stable Doxepin 10 to 20 mg p.o. nightly as needed  Social anxiety disorder-stable Zoloft 100 mg p.o. daily  Adjustment disorder-stable We will monitor closely.  Continue CBT as needed  Follow-up in clinic in 4 to 5 months or sooner if needed.  I have spent atleast 20 minutes face to face by video with patient today. More than 50 % of the time was spent for preparing to see the patient ( e.g., review of test, records ), ordering medications and test ,psychoeducation and supportive psychotherapy and care coordination,as well as documenting clinical information in electronic health record. This note was generated in part or whole with voice recognition software. Voice recognition is usually quite accurate but there are transcription errors that can and very often do occur. I apologize for any typographical errors that were not detected and corrected.       Jomarie Longs, MD 01/06/2020, 8:22 AM

## 2020-01-12 ENCOUNTER — Other Ambulatory Visit: Payer: Self-pay

## 2020-01-12 DIAGNOSIS — J309 Allergic rhinitis, unspecified: Secondary | ICD-10-CM

## 2020-01-12 MED ORDER — IPRATROPIUM BROMIDE 0.06 % NA SOLN: 2.0000 | Freq: Four times a day (QID) | NASAL | 2 refills | Status: DC

## 2020-01-31 ENCOUNTER — Other Ambulatory Visit: Payer: Self-pay | Admitting: Family

## 2020-01-31 DIAGNOSIS — F4010 Social phobia, unspecified: Secondary | ICD-10-CM

## 2020-03-15 ENCOUNTER — Other Ambulatory Visit: Payer: Self-pay | Admitting: Family

## 2020-04-20 ENCOUNTER — Other Ambulatory Visit: Payer: Self-pay | Admitting: Family

## 2020-04-20 DIAGNOSIS — G43009 Migraine without aura, not intractable, without status migrainosus: Secondary | ICD-10-CM

## 2020-05-01 ENCOUNTER — Other Ambulatory Visit: Payer: Self-pay | Admitting: Family

## 2020-05-01 DIAGNOSIS — F4010 Social phobia, unspecified: Secondary | ICD-10-CM

## 2020-05-02 NOTE — Telephone Encounter (Signed)
Dr Elna Breslow aware that patient is on zoloft 100mg  and doxepin which she prescribes.

## 2020-05-03 ENCOUNTER — Encounter: Payer: Self-pay | Admitting: Psychiatry

## 2020-05-03 ENCOUNTER — Telehealth (INDEPENDENT_AMBULATORY_CARE_PROVIDER_SITE_OTHER): Payer: BC Managed Care – PPO | Admitting: Psychiatry

## 2020-05-03 ENCOUNTER — Other Ambulatory Visit: Payer: Self-pay

## 2020-05-03 DIAGNOSIS — F4010 Social phobia, unspecified: Secondary | ICD-10-CM

## 2020-05-03 DIAGNOSIS — F5101 Primary insomnia: Secondary | ICD-10-CM

## 2020-05-03 MED ORDER — DOXEPIN HCL 10 MG PO CAPS: 30.0000 mg | ORAL_CAPSULE | Freq: Every evening | ORAL | 1 refills | Status: DC | PRN

## 2020-05-03 MED ORDER — SERTRALINE HCL 100 MG PO TABS: 50.0000 mg | ORAL_TABLET | Freq: Every day | ORAL | 3 refills | Status: DC

## 2020-05-03 NOTE — Progress Notes (Signed)
Virtual Visit via Video Note  I connected with Heather [name] on 05/03/20 at  4:00 PM EDT by a video enabled telemedicine application and verified that I am speaking with the correct person using two identifiers.  Location Provider Location : [facility] Patient Location : Home  Participants: Patient , Provider   I discussed the limitations of evaluation and management by telemedicine and the availability of in person appointments. The patient expressed understanding and agreed to proceed.   I discussed the assessment and treatment plan with the patient. The patient was provided an opportunity to ask questions and all were answered. The patient agreed with the plan and demonstrated an understanding of the instructions.   The patient was advised to call back or seek an in-person evaluation if the symptoms worsen or if the condition fails to improve as anticipated.  BH MD OP Progress Note  05/03/2020 4:17 PM SHIARA MCGOUGH  MRN:  [id number]  Chief Complaint:  Chief Complaint    Follow-up; Anxiety; Insomnia     HPI: Heather [name] is a 45 year old Caucasian female, employed, married, lives in [place], has a history of primary insomnia, social anxiety disorder, adjustment disorder with mixed anxiety and depressed mood was evaluated by telemedicine today.  Patient reports she continues to have job related stressors.  She reports she often thinks about whether she wants to continue this job or not.  She however does have help available now.  Her job has hired a Leisure centre manager to assist and that has been very beneficial.  She reports she was sleeping okay on the doxepin however since the daylight saving began the past weekend she has not been able to sleep at all.  She reports she is currently taking 20 mg of the doxepin.  She is following a good sleep hygiene and that has not been helpful.  She is compliant on the Zoloft.  Denies side effects.  Patient denies any  suicidality, homicidality or perceptual disturbances.  Patient denies any other concerns today.  Visit Diagnosis:    ICD-10-CM   1. Primary insomnia  F51.01 doxepin (SINEQUAN) 10 MG capsule  2. Social anxiety disorder  F40.10 sertraline (ZOLOFT) 100 MG tablet    doxepin (SINEQUAN) 10 MG capsule    Past Psychiatric History: I have reviewed past psychiatric history from my progress note on 11/28/2018  Past Medical History:  Past Medical History:  Diagnosis Date  . Hyperlipidemia   . Hypertension   . Nail fungus 06.14.2014    Past Surgical History:  Procedure Laterality Date  . VAGINAL DELIVERY      Family Psychiatric History: I have reviewed family psychiatric history from my progress note on 11/28/2018  Family History:  Family History  Problem Relation Age of Onset  . Hypertension Mother   . Insomnia Mother   . Heart disease Father   . Heart disease Maternal Grandmother   . Heart disease Maternal Grandfather   . CVA Paternal Grandmother   . Breast cancer Neg Hx   . Allergic rhinitis Neg Hx   . Eczema Neg Hx   . Asthma Neg Hx   . Urticaria Neg Hx   . Thyroid cancer Neg Hx     Social History: I have reviewed social history from my progress note on 11/28/2018 Social History   Socioeconomic History  . Marital status: Married    Spouse name: Not on file  . Number of children: 1  . Years of education: Not on file  . Highest  education level: Not on file  Occupational History  . Not on file  Tobacco Use  . Smoking status: Never Smoker  . Smokeless tobacco: Never Used  Vaping Use  . Vaping Use: Never used  Substance and Sexual Activity  . Alcohol use: No  . Drug use: No  . Sexual activity: Yes    Partners: Male    Birth control/protection: Surgical  Other Topics Concern  . Not on file  Social History Narrative   Lives with daughter and husband in [place]. Works as Estate manager/land agent.   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    Social Determinants of Health   Financial Resource  Strain: Not on file  Food Insecurity: Not on file  Transportation Needs: Not on file  Physical Activity: Not on file  Stress: Not on file  Social Connections: Not on file    Allergies:  Allergies  Allergen Reactions  . Gluten Meal Diarrhea    Metabolic Disorder Labs: Lab Results  Component Value Date   HGBA1C 5.7 09/10/2019   No results found for: PROLACTIN Lab Results  Component Value Date   CHOL 307 (H) 09/10/2019   TRIG 68.0 09/10/2019   HDL 64.10 09/10/2019   CHOLHDL 5 09/10/2019   VLDL 13.6 09/10/2019   LDLCALC 230 (H) 09/10/2019   LDLCALC 177 (H) 10/22/2018   Lab Results  Component Value Date   TSH 1.48 09/10/2019   TSH 1.66 10/22/2018    Therapeutic Level Labs: No results found for: LITHIUM No results found for: VALPROATE No components found for:  CBMZ  Current Medications: Current Outpatient Medications  Medication Sig Dispense Refill  . Diclofenac Sodium (PENNSAID) 2 % SOLN Place onto the skin.    Marland Kitchen doxepin (SINEQUAN) 10 MG capsule Take 3 capsules (30 mg total) by mouth at bedtime as needed. For sleep 45 capsule 1  . hydrochlorothiazide (HYDRODIURIL) 25 MG tablet Take 1 tablet (25 mg total) by mouth daily. 90 tablet 1  . ipratropium (ATROVENT) 0.06 % nasal spray Place 2 sprays into both nostrils 4 (four) times daily. 15 mL 2  . KLOR-CON M20 20 MEQ tablet Take 20 mEq by mouth daily.    . Liraglutide -Weight Management (SAXENDA) 18 MG/3ML SOPN Inject 1.8 mg into the skin daily. 3 mL 3  . metoprolol succinate (TOPROL-XL) 100 MG 24 hr tablet TAKE 1 TABLET BY MOUTH ONCE DAILY WITH  OR  IMMEDIATELY  FOLLOWING  A  MEAL 90 tablet 1  . potassium chloride 20 MEQ TBCR Take 20 mEq by mouth daily. 90 tablet 1  . rizatriptan (MAXALT) 10 MG tablet TAKE ONE TABLET BY MOUTH AS NEEDED FOR MIGRAINE. MAY REPEAT IN TWO HOURS IF NEEDED 10 tablet 2  . rosuvastatin (CRESTOR) 20 MG tablet Take 1 tablet by mouth once daily 90 tablet 0  . sertraline (ZOLOFT) 100 MG tablet Take 0.5  tablets (50 mg total) by mouth daily. 90 tablet 3   No current facility-administered medications for this visit.     Musculoskeletal: Strength & Muscle Tone: UTA Gait & Station: normal Patient leans: N/A  Psychiatric Specialty Exam: Review of Systems  Psychiatric/Behavioral: Positive for sleep disturbance.  All other systems reviewed and are negative.   There were no vitals taken for this visit.There is no height or weight on file to calculate BMI.  General Appearance: Casual  Eye Contact:  Fair  Speech:  Normal Rate  Volume:  Normal  Mood:  Euthymic  Affect:  Congruent  Thought Process:  Goal Directed and Descriptions of  Associations: Intact  Orientation:  Full (Time, Place, and Person)  Thought Content: Logical   Suicidal Thoughts:  No  Homicidal Thoughts:  No  Memory:  Immediate;   Fair Recent;   Fair Remote;   Fair  Judgement:  Fair  Insight:  Fair  Psychomotor Activity:  Normal  Concentration:  Concentration: Fair and Attention Span: Fair  Recall:  [employer] of Knowledge: Fair  Language: Fair  Akathisia:  No  Handed:  Right  AIMS (if indicated): UTA  Assets:  Communication Skills Desire for Improvement Housing Social Support  ADL's:  Intact  Cognition: WNL  Sleep:  Poor   Screenings: GAD-7   Flowsheet Row Counselor from [facility]02/2019 in [facility]  Total GAD-7 Score 15    PHQ2-9   Flowsheet Row Video Visit from 05/03/2020 in [facility] Counselor from 11/02/2019 in [facility] Office Visit from 09/10/2019 in [facility] Office Visit from 11/03/2018 in [facility] Office Visit from 09/26/2016 in [facility]  PHQ-2 Total Score 0 3 0 0 0  PHQ-9 Total Score -- 11 -- -- --    Flowsheet Row Video Visit from 05/03/2020 in [facility]  C-SSRS RISK CATEGORY No Risk       Assessment and Plan:  Heather [name] is a 45 year old Caucasian female who has a history of primary insomnia, social anxiety disorder, hypertension, migraine headaches was evaluated by telemedicine today.  Patient is currently struggling with sleep problems.  She will benefit from the following plan.  Plan Primary insomnia-unstable Increase doxepin to 30 mg p.o. nightly as needed Continue sleep hygiene techniques  Social anxiety disorder-stable Reduce Zoloft to 50 mg p.o. daily  Follow-up in clinic in person in 1 month.  This note was generated in part or whole with voice recognition software. Voice recognition is usually quite accurate but there are transcription errors that can and very often do occur. I apologize for any typographical errors that were not detected and corrected.      Jomarie Longs, MD 05/04/2020, 12:59 PM

## 2020-06-07 ENCOUNTER — Ambulatory Visit: Payer: Self-pay | Admitting: Psychiatry

## 2020-06-10 ENCOUNTER — Other Ambulatory Visit: Payer: Self-pay | Admitting: Family

## 2020-06-10 DIAGNOSIS — G43009 Migraine without aura, not intractable, without status migrainosus: Secondary | ICD-10-CM

## 2020-06-10 DIAGNOSIS — I10 Essential (primary) hypertension: Secondary | ICD-10-CM

## 2020-07-02 ENCOUNTER — Other Ambulatory Visit: Payer: Self-pay | Admitting: Psychiatry

## 2020-07-02 ENCOUNTER — Other Ambulatory Visit: Payer: Self-pay | Admitting: Family

## 2020-07-02 DIAGNOSIS — F5101 Primary insomnia: Secondary | ICD-10-CM

## 2020-07-02 DIAGNOSIS — F4010 Social phobia, unspecified: Secondary | ICD-10-CM

## 2020-07-02 DIAGNOSIS — J309 Allergic rhinitis, unspecified: Secondary | ICD-10-CM

## 2020-07-19 ENCOUNTER — Encounter: Payer: Self-pay | Admitting: Psychiatry

## 2020-07-19 ENCOUNTER — Other Ambulatory Visit: Payer: Self-pay

## 2020-07-19 ENCOUNTER — Telehealth (INDEPENDENT_AMBULATORY_CARE_PROVIDER_SITE_OTHER): Payer: BC Managed Care – PPO | Admitting: Psychiatry

## 2020-07-19 DIAGNOSIS — F4010 Social phobia, unspecified: Secondary | ICD-10-CM

## 2020-07-19 DIAGNOSIS — F5101 Primary insomnia: Secondary | ICD-10-CM

## 2020-07-19 MED ORDER — SERTRALINE HCL 25 MG PO TABS: 25.0000 mg | ORAL_TABLET | Freq: Every day | ORAL | 0 refills | Status: DC

## 2020-07-19 MED ORDER — DOXEPIN HCL 10 MG PO CAPS: 10.0000 mg | ORAL_CAPSULE | Freq: Every evening | ORAL | 1 refills | Status: DC | PRN

## 2020-07-19 NOTE — Progress Notes (Signed)
Virtual Visit via Video Note  I connected with Heather [name] on 07/19/20 at  4:30 PM EDT by a video enabled telemedicine application and verified that I am speaking with the correct person using two identifiers.  Location Provider Location : [facility] Patient Location : Home  Participants: Patient , Provider   I discussed the limitations of evaluation and management by telemedicine and the availability of in person appointments. The patient expressed understanding and agreed to proceed.   I discussed the assessment and treatment plan with the patient. The patient was provided an opportunity to ask questions and all were answered. The patient agreed with the plan and demonstrated an understanding of the instructions.   The patient was advised to call back or seek an in-person evaluation if the symptoms worsen or if the condition fails to improve as anticipated.   BH MD OP Progress Note  07/19/2020 4:48 PM BRAYLEE LAL  MRN:  [id number]  Chief Complaint:  Chief Complaint    Follow-up; Anxiety; Insomnia     HPI: Heather [name] is a 45 year old Caucasian female, employed, married, lives in [place], has a history of primary insomnia, social anxiety disorder, adjustment disorder with mixed anxiety and depressed mood was evaluated by telemedicine today.  Patient today reports she is currently recovering from COVID-19 infection.  She reports she has lost her sense of taste and smell and also continues to have congestion, fatigue and myalgia.  She currently does not have a fever.  She is back at work however reports she has only 3 more days of work and then school closes for the summer.  Patient reports she stopped taking the higher dosage of doxepin and currently takes doxepin 20 mg daily.  She reports she is sleeping okay on that and wants to stay on that dose at this time.  She denies any significant anxiety or depression.  She is compliant on sertraline.  Patient  denies any suicidality, homicidality or perceptual disturbances.  Patient denies any other concerns today.  Visit Diagnosis:    ICD-10-CM   1. Primary insomnia  F51.01 doxepin (SINEQUAN) 10 MG capsule  2. Social anxiety disorder  F40.10 sertraline (ZOLOFT) 25 MG tablet    doxepin (SINEQUAN) 10 MG capsule    Past Psychiatric History: I have reviewed past psychiatric history from progress note on 11/28/2018  Past Medical History:  Past Medical History:  Diagnosis Date  . Hyperlipidemia   . Hypertension   . Nail fungus 06.14.2014    Past Surgical History:  Procedure Laterality Date  . VAGINAL DELIVERY      Family Psychiatric History: I have reviewed family psychiatric history from progress note on 11/28/2018  Family History:  Family History  Problem Relation Age of Onset  . Hypertension Mother   . Insomnia Mother   . Heart disease Father   . Heart disease Maternal Grandmother   . Heart disease Maternal Grandfather   . CVA Paternal Grandmother   . Breast cancer Neg Hx   . Allergic rhinitis Neg Hx   . Eczema Neg Hx   . Asthma Neg Hx   . Urticaria Neg Hx   . Thyroid cancer Neg Hx     Social History: I have reviewed social history from progress note on 11/28/2018 Social History   Socioeconomic History  . Marital status: Married    Spouse name: Not on file  . Number of children: 1  . Years of education: Not on file  . Highest education level: Not  on file  Occupational History  . Not on file  Tobacco Use  . Smoking status: Never Smoker  . Smokeless tobacco: Never Used  Vaping Use  . Vaping Use: Never used  Substance and Sexual Activity  . Alcohol use: No  . Drug use: No  . Sexual activity: Yes    Partners: Male    Birth control/protection: Surgical  Other Topics Concern  . Not on file  Social History Narrative   Lives with daughter and husband in [place]. Works as Estate manager/land agent.   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    Social Determinants of Health   Financial Resource  Strain: Not on file  Food Insecurity: Not on file  Transportation Needs: Not on file  Physical Activity: Not on file  Stress: Not on file  Social Connections: Not on file    Allergies:  Allergies  Allergen Reactions  . Gluten Meal Diarrhea    Metabolic Disorder Labs: Lab Results  Component Value Date   HGBA1C 5.7 09/10/2019   No results found for: PROLACTIN Lab Results  Component Value Date   CHOL 307 (H) 09/10/2019   TRIG 68.0 09/10/2019   HDL 64.10 09/10/2019   CHOLHDL 5 09/10/2019   VLDL 13.6 09/10/2019   LDLCALC 230 (H) 09/10/2019   LDLCALC 177 (H) 10/22/2018   Lab Results  Component Value Date   TSH 1.48 09/10/2019   TSH 1.66 10/22/2018    Therapeutic Level Labs: No results found for: LITHIUM No results found for: VALPROATE No components found for:  CBMZ  Current Medications: Current Outpatient Medications  Medication Sig Dispense Refill  . sertraline (ZOLOFT) 25 MG tablet Take 1 tablet (25 mg total) by mouth daily. 90 tablet 0  . Diclofenac Sodium (PENNSAID) 2 % SOLN Place onto the skin.    Marland Kitchen doxepin (SINEQUAN) 10 MG capsule Take 1-2 capsules (10-20 mg total) by mouth at bedtime as needed. 60 capsule 1  . hydrochlorothiazide (HYDRODIURIL) 25 MG tablet Take 1 tablet by mouth once daily 90 tablet 0  . ipratropium (ATROVENT) 0.06 % nasal spray USE 2 SPRAY(S) IN EACH NOSTRIL 4 TIMES DAILY 15 mL 0  . KLOR-CON M20 20 MEQ tablet Take 20 mEq by mouth daily.    . Liraglutide -Weight Management (SAXENDA) 18 MG/3ML SOPN Inject 1.8 mg into the skin daily. 3 mL 3  . metoprolol succinate (TOPROL-XL) 100 MG 24 hr tablet TAKE 1 TABLET BY MOUTH ONCE DAILY WITH  OR  IMMEDIATELY  FOLLOWING  A  MEAL 90 tablet 0  . potassium chloride 20 MEQ TBCR Take 20 mEq by mouth daily. 90 tablet 1  . rizatriptan (MAXALT) 10 MG tablet TAKE ONE TABLET BY MOUTH AS NEEDED FOR MIGRAINE. MAY REPEAT IN TWO HOURS IF NEEDED 10 tablet 2  . rosuvastatin (CRESTOR) 20 MG tablet Take 1 tablet by mouth  once daily 90 tablet 0   No current facility-administered medications for this visit.     Musculoskeletal: Strength & Muscle Tone: UTA Gait & Station: UTA Patient leans: N/A  Psychiatric Specialty Exam: Review of Systems  Constitutional: Positive for fatigue.       Loss of sense of taste and smell due to covid 19  HENT: Positive for congestion.   Musculoskeletal: Positive for myalgias.  All other systems reviewed and are negative.   There were no vitals taken for this visit.There is no height or weight on file to calculate BMI.  General Appearance: Casual  Eye Contact:  Fair  Speech:  Clear and Coherent  Volume:  Normal  Mood:  Euthymic  Affect:  Congruent  Thought Process:  Goal Directed and Descriptions of Associations: Intact  Orientation:  Full (Time, Place, and Person)  Thought Content: Logical   Suicidal Thoughts:  No  Homicidal Thoughts:  No  Memory:  Immediate;   Fair Recent;   Fair Remote;   Fair  Judgement:  Fair  Insight:  Fair  Psychomotor Activity:  Normal  Concentration:  Concentration: Fair and Attention Span: Fair  Recall:  [employer] of Knowledge: Fair  Language: Fair  Akathisia:  No  Handed:  Right  AIMS (if indicated): UTA  Assets:  Communication Skills Desire for Improvement Housing Social Support  ADL's:  Intact  Cognition: WNL  Sleep:  Improving   Screenings: GAD-7   Flowsheet Row Video Visit from 07/19/2020 in [facility] Counselor from 11/02/2019 in [facility]  Total GAD-7 Score 0 15    PHQ2-9   Flowsheet Row Video Visit from 07/19/2020 in [facility] Video Visit from 05/03/2020 in [facility] Counselor from 11/02/2019 in [facility] Office Visit from 09/10/2019 in [facility] Office Visit from 11/03/2018 in [facility]   PHQ-2 Total Score 0 0 3 0 0  PHQ-9  Total Score -- -- 11 -- --    Flowsheet Row Video Visit from 07/19/2020 in [facility] Video Visit from 05/03/2020 in [facility]  C-SSRS RISK CATEGORY No Risk No Risk       Assessment and Plan: Heather [name] is a 45 year old Caucasian female who has a history of primary insomnia, social anxiety, hypertension, migraine headaches recent COVID-19 infection was evaluated by telemedicine today.  Patient is currently recovering from COVID-19, reports mood and sleep is good.  Plan Primary insomnia-improving Reduced to doxepin 20 mg p.o. nightly as needed Continue sleep hygiene techniques  Social anxiety disorder-stable Reduce Zoloft to 25 mg p.o. daily  Patient to monitor her symptoms closely and let writer know if she is interested in changing her medication dosage.  Follow-up in clinic in 2 months or sooner if needed.  This note was generated in part or whole with voice recognition software. Voice recognition is usually quite accurate but there are transcription errors that can and very often do occur. I apologize for any typographical errors that were not detected and corrected.        Jomarie Longs, MD 07/20/2020, 8:48 AM

## 2020-09-04 ENCOUNTER — Other Ambulatory Visit: Payer: Self-pay | Admitting: Family

## 2020-09-04 DIAGNOSIS — G43009 Migraine without aura, not intractable, without status migrainosus: Secondary | ICD-10-CM

## 2020-09-04 DIAGNOSIS — I10 Essential (primary) hypertension: Secondary | ICD-10-CM

## 2020-09-05 MED ORDER — HYDROCHLOROTHIAZIDE 25 MG PO TABS
25.0000 mg | ORAL_TABLET | Freq: Every day | ORAL | 0 refills | Status: DC
Start: 2020-09-05 — End: 2020-09-28

## 2020-09-05 MED ORDER — ROSUVASTATIN CALCIUM 20 MG PO TABS: 20.0000 mg | ORAL_TABLET | Freq: Every day | ORAL | 0 refills | Status: DC

## 2020-09-05 MED ORDER — METOPROLOL SUCCINATE ER 100 MG PO TB24: ORAL_TABLET | ORAL | 0 refills | Status: DC

## 2020-09-08 ENCOUNTER — Other Ambulatory Visit: Payer: Self-pay | Admitting: Family

## 2020-09-08 DIAGNOSIS — G43009 Migraine without aura, not intractable, without status migrainosus: Secondary | ICD-10-CM

## 2020-09-08 DIAGNOSIS — I10 Essential (primary) hypertension: Secondary | ICD-10-CM

## 2020-09-21 ENCOUNTER — Other Ambulatory Visit: Payer: Self-pay

## 2020-09-21 ENCOUNTER — Telehealth (HOSPITAL_BASED_OUTPATIENT_CLINIC_OR_DEPARTMENT_OTHER): Payer: BC Managed Care – PPO | Admitting: Psychiatry

## 2020-09-21 DIAGNOSIS — F5101 Primary insomnia: Secondary | ICD-10-CM

## 2020-09-21 NOTE — Progress Notes (Signed)
Patient is currently out of state and will reschedule.

## 2020-09-28 ENCOUNTER — Encounter: Payer: Self-pay | Admitting: Family

## 2020-09-28 ENCOUNTER — Ambulatory Visit: Payer: BC Managed Care – PPO | Admitting: Family

## 2020-09-28 ENCOUNTER — Other Ambulatory Visit: Payer: Self-pay

## 2020-09-28 VITALS — BP 88/60 | HR 78 | Temp 98.6°F | Wt 159.0 lb

## 2020-09-28 DIAGNOSIS — R635 Abnormal weight gain: Secondary | ICD-10-CM | POA: Diagnosis not present

## 2020-09-28 DIAGNOSIS — Z1159 Encounter for screening for other viral diseases: Secondary | ICD-10-CM

## 2020-09-28 DIAGNOSIS — E785 Hyperlipidemia, unspecified: Secondary | ICD-10-CM | POA: Diagnosis not present

## 2020-09-28 DIAGNOSIS — I10 Essential (primary) hypertension: Secondary | ICD-10-CM

## 2020-09-28 DIAGNOSIS — Z1211 Encounter for screening for malignant neoplasm of colon: Secondary | ICD-10-CM | POA: Diagnosis not present

## 2020-09-28 MED ORDER — WEGOVY 0.25 MG/0.5ML ~~LOC~~ SOAJ: 0.2500 mg | SUBCUTANEOUS | 2 refills | Status: DC

## 2020-09-28 MED ORDER — HYDROCHLOROTHIAZIDE 12.5 MG PO TABS: 12.5000 mg | ORAL_TABLET | Freq: Every day | ORAL | 1 refills | Status: DC

## 2020-09-28 NOTE — Progress Notes (Signed)
Subjective:    Patient ID: Heather [name], female    DOB: September 07, 1975, 45 y.o.   MRN: [id number]  CC: Heather [name] is a 45 y.o. female who presents today for follow up.     HPI: She feels well today.  She is mostly concerned with her weight gain.  She remains frustrated by this. In the past she had been on Saxenda without weight loss  She would be interested in trying another medication.    Hypertension-compliant with hydrochlorothiazide 25 mg, metoprolol succinate 100 mg. She hasnt been on potassium chloride 20 mEq qd.  She reports being dizzy at times when she goes from sitting to standing.  No syncope, chest pain  Hyperlipidemia-compliant with Crestor 20mg   Migraine- HA are unchanged in presentation, or frequency.  No vision loss, aura. Occur once per month with menses. Takes maxalt 10 mg with relief.  No  personal or family h/o thyroid cancer     Following with Dr for insomnia, compliant with doxepin 20mg , zoloft 25mg   Mammogram and colonoscopy due HISTORY:  Past Medical History:  Diagnosis Date   Hyperlipidemia    Hypertension    Nail fungus 06.14.2014   Past Surgical History:  Procedure Laterality Date   VAGINAL DELIVERY     Family History  Problem Relation Age of Onset   Hypertension Mother    Insomnia Mother    Heart disease Father    Heart disease Maternal Grandmother    Heart disease Maternal Grandfather    CVA Paternal Grandmother    Breast cancer Neg Hx    Allergic rhinitis Neg Hx    Eczema Neg Hx    Asthma Neg Hx    Urticaria Neg Hx    Thyroid cancer Neg Hx     Allergies: Gluten meal Current Outpatient Medications on File Prior to Visit  Medication Sig Dispense Refill   Diclofenac Sodium 2 % SOLN Place onto the skin.     doxepin (SINEQUAN) 10 MG capsule Take 1-2 capsules (10-20 mg total) by mouth at bedtime as needed. 60 capsule 1   ipratropium (ATROVENT) 0.06 % nasal spray USE 2 SPRAY(S) IN EACH NOSTRIL 4 TIMES DAILY 15 mL 0    KLOR-CON M20 20 MEQ tablet Take 20 mEq by mouth daily.     metoprolol succinate (TOPROL-XL) 100 MG 24 hr tablet Take with or immediately following a meal. 90 tablet 0   rizatriptan (MAXALT) 10 MG tablet TAKE ONE TABLET BY MOUTH AS NEEDED FOR MIGRAINE. MAY REPEAT IN TWO HOURS IF NEEDED 10 tablet 2   rosuvastatin (CRESTOR) 20 MG tablet Take 1 tablet (20 mg total) by mouth daily. 90 tablet 0   sertraline (ZOLOFT) 25 MG tablet Take 1 tablet (25 mg total) by mouth daily. 90 tablet 0   No current facility-administered medications on file prior to visit.    Social History   Tobacco Use   Smoking status: Never   Smokeless tobacco: Never  Vaping Use   Vaping Use: Never used  Substance Use Topics   Alcohol use: No   Drug use: No    Review of Systems  Constitutional:  Negative for chills and fever.  Respiratory:  Negative for cough.   Cardiovascular:  Negative for chest pain and palpitations.  Gastrointestinal:  Negative for nausea and vomiting.  Neurological:  Positive for dizziness. Negative for headaches (well controlled).     Objective:    BP (!) 88/60 (BP Location: Left Arm, Patient Position: Sitting, Cuff Size:  Large)   Pulse 78   Temp 98.6 F (37 C) (Oral)   Wt 159 lb (72.1 kg)   SpO2 99%   BMI 28.17 kg/m  BP Readings from Last 3 Encounters:  09/28/20 (!) 88/60  09/10/19 110/70  10/31/18 132/83   Wt Readings from Last 3 Encounters:  09/28/20 159 lb (72.1 kg)  12/11/19 140 lb (63.5 kg)  09/10/19 149 lb 9.6 oz (67.9 kg)    Physical Exam Vitals reviewed.  Constitutional:      Appearance: She is well-developed.  Eyes:     Conjunctiva/sclera: Conjunctivae normal.  Cardiovascular:     Rate and Rhythm: Normal rate and regular rhythm.     Pulses: Normal pulses.     Heart sounds: Normal heart sounds.  Pulmonary:     Effort: Pulmonary effort is normal.     Breath sounds: Normal breath sounds. No wheezing, rhonchi or rales.  Skin:    General: Skin is warm and dry.   Neurological:     Mental Status: She is alert.  Psychiatric:        Speech: Speech normal.        Behavior: Behavior normal.        Thought Content: Thought content normal.       Assessment & Plan:   Problem List Items Addressed This Visit       Cardiovascular and Mediastinum   Hypertension - Primary    Blood pressure is on the low end and patient reports dizziness.  We agreed to decrease hydrochlorothiazide to 12.5mg .  She has not been on potassium.  Pending BMP today and in 1 week's time to assess if she needs potassium replacement.       Relevant Medications   Semaglutide-Weight Management (WEGOVY) 0.25 MG/0.5ML SOAJ   hydrochlorothiazide (HYDRODIURIL) 12.5 MG tablet   Other Relevant Orders   Basic metabolic panel   CBC with Differential/Platelet   Hemoglobin A1c   Hepatitis C antibody   MM DIAG BREAST TOMO BILATERAL   Ambulatory referral to Gastroenterology   Basic metabolic panel     Other   Hyperlipidemia    Presumed controlled, pending lipid panel.  Continue Crestor 20 mg       Relevant Medications   hydrochlorothiazide (HYDRODIURIL) 12.5 MG tablet   Other Relevant Orders   Lipid panel   Weight gain    Counseled on black box warning regarding medullary thyroid cancer.  We jointly agreed reasonable to try once weekly Wegovy to see if more effective than Saxenda.  Close follow-up in 3 months.       Relevant Medications   Semaglutide-Weight Management (WEGOVY) 0.25 MG/0.5ML SOAJ   Other Relevant Orders   TSH   Other Visit Diagnoses     Screen for colon cancer       Relevant Orders   Ambulatory referral to Gastroenterology   Encounter for hepatitis C screening test for low risk patient       Essential hypertension       Relevant Medications   hydrochlorothiazide (HYDRODIURIL) 12.5 MG tablet      Of note, we scheduled mammogram  I have discontinued Darol Destine. Heather [name] Potassium Chloride ER and Saxenda. I have also changed her  hydrochlorothiazide. Additionally, I am having her start on Wegovy. Lastly, I am having her maintain her Diclofenac Sodium, Klor-Con M20, rizatriptan, ipratropium, sertraline, doxepin, rosuvastatin, and metoprolol succinate.   Meds ordered this encounter  Medications   Semaglutide-Weight Management (WEGOVY) 0.25 MG/0.5ML SOAJ    Sig:  Inject 0.25 mg into the skin once a week.    Dispense:  2 mL    Refill:  2    Order Specific Question:   Supervising Provider    Answer:   Duncan Dull L [[id number]]   hydrochlorothiazide (HYDRODIURIL) 12.5 MG tablet    Sig: Take 1 tablet (12.5 mg total) by mouth daily.    Dispense:  90 tablet    Refill:  1    Order Specific Question:   Supervising Provider    Answer:   Sherlene Shams [[id number]]    Return precautions given.   Risks, benefits, and alternatives of the medications and treatment plan prescribed today were discussed, and patient expressed understanding.   Education regarding symptom management and diagnosis given to patient on AVS.  Continue to follow with Allegra Grana, FNP for routine health maintenance.   Heather [name] and I agreed with plan.   Rennie Plowman, FNP

## 2020-09-28 NOTE — Assessment & Plan Note (Signed)
Presumed controlled, pending lipid panel.  Continue Crestor 20 mg

## 2020-09-28 NOTE — Assessment & Plan Note (Signed)
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  We jointly agreed reasonable to try once weekly Wegovy to see if more effective than Saxenda.  Close follow-up in 3 months.

## 2020-09-28 NOTE — Assessment & Plan Note (Addendum)
Blood pressure is on the low end and patient reports dizziness.  We agreed to decrease hydrochlorothiazide to 12.5mg .  She has not been on potassium.  Pending BMP today and in 1 week's time to assess if she needs potassium replacement.

## 2020-09-28 NOTE — Patient Instructions (Addendum)
Decrease hctz to 12.5mg . Continue metoprolol succinate 100mg   Labs in one week.   We have discussed starting non insulin daily injectable medication called Wegovy  which is a glucagon like peptide (GLP 1) agonist and works by delaying gastric emptying and increasing insulin secretion.It is given once per week. Most patients see significant weight loss with this drug class.   You may NOT take either medication if you or your family has history of thyroid, parathyroid, OR adrenal cancer. Please confirm you and your family does NOT have this history as this drug class has black box warning on this medication for that reason.   Advise to follow with directions on prescription and slowly increase from 0.25mg  [place] once per week ;stay here for 4 weeks. You may then increase to 0.5mg  [place] once per week and stay there for 4 weeks.  We can slowly titrate further at follow up with goal of no more than 1-2 lbs weight loss per week.  Referral for colonoscopy Mammogram as scheduled

## 2020-09-29 ENCOUNTER — Other Ambulatory Visit: Payer: Self-pay | Admitting: Psychiatry

## 2020-09-29 DIAGNOSIS — F4010 Social phobia, unspecified: Secondary | ICD-10-CM

## 2020-09-29 DIAGNOSIS — F5101 Primary insomnia: Secondary | ICD-10-CM

## 2020-09-29 LAB — CBC WITH DIFFERENTIAL/PLATELET
Basophils Absolute: 0.1 10*3/uL (ref 0.0–0.1)
Basophils Relative: 1.2 % (ref 0.0–3.0)
Eosinophils Absolute: 0.1 10*3/uL (ref 0.0–0.7)
Eosinophils Relative: 1.6 % (ref 0.0–5.0)
HCT: 33.1 % — ABNORMAL LOW (ref 36.0–46.0)
Hemoglobin: 10.9 g/dL — ABNORMAL LOW (ref 12.0–15.0)
Lymphocytes Relative: 35.9 % (ref 12.0–46.0)
Lymphs Abs: 2.2 10*3/uL (ref 0.7–4.0)
MCHC: 33.1 g/dL (ref 30.0–36.0)
MCV: 74.2 fl — ABNORMAL LOW (ref 78.0–100.0)
Monocytes Absolute: 0.4 10*3/uL (ref 0.1–1.0)
Monocytes Relative: 6.4 % (ref 3.0–12.0)
Neutro Abs: 3.3 10*3/uL (ref 1.4–7.7)
Neutrophils Relative %: 54.9 % (ref 43.0–77.0)
Platelets: 306 10*3/uL (ref 150.0–400.0)
RBC: 4.46 Mil/uL (ref 3.87–5.11)
RDW: 17.6 % — ABNORMAL HIGH (ref 11.5–15.5)
WBC: 6.1 10*3/uL (ref 4.0–10.5)

## 2020-09-29 LAB — BASIC METABOLIC PANEL
BUN: 20 mg/dL (ref 6–23)
CO2: 29 mEq/L (ref 19–32)
Calcium: 10.2 mg/dL (ref 8.4–10.5)
Chloride: 99 mEq/L (ref 96–112)
Creatinine, Ser: 0.85 mg/dL (ref 0.40–1.20)
GFR: 82.11 mL/min (ref >60.00)
Glucose, Bld: 91 mg/dL (ref 70–99)
Potassium: 4.3 mEq/L (ref 3.5–5.1)
Sodium: 137 mEq/L (ref 135–145)

## 2020-09-29 LAB — HEPATITIS C ANTIBODY
Hepatitis C Ab: NONREACTIVE
SIGNAL TO CUT-OFF: 0.03 (ref <1.00)

## 2020-09-29 LAB — HEMOGLOBIN A1C: Hgb A1c MFr Bld: 6 % (ref 4.6–6.5)

## 2020-09-29 LAB — TSH: TSH: 2.76 u[IU]/mL (ref 0.35–5.50)

## 2020-10-04 ENCOUNTER — Other Ambulatory Visit: Payer: Self-pay | Admitting: Family

## 2020-10-04 DIAGNOSIS — D649 Anemia, unspecified: Secondary | ICD-10-CM

## 2020-10-05 ENCOUNTER — Encounter: Payer: Self-pay | Admitting: *Deleted

## 2020-10-05 ENCOUNTER — Telehealth: Payer: Self-pay | Admitting: Family

## 2020-10-05 NOTE — Telephone Encounter (Signed)
Heather [name]

## 2020-10-05 NOTE — Telephone Encounter (Signed)
PT called to return missed call about lab results. 

## 2020-10-07 ENCOUNTER — Other Ambulatory Visit: Payer: Self-pay

## 2020-10-07 ENCOUNTER — Ambulatory Visit
Admission: RE | Admit: 2020-10-07 | Discharge: 2020-10-07 | Disposition: A | Discharge status: Home / Self Care | Payer: BC Managed Care – PPO | Source: Ambulatory Visit | Attending: Family | Admitting: Family

## 2020-10-07 ENCOUNTER — Other Ambulatory Visit (INDEPENDENT_AMBULATORY_CARE_PROVIDER_SITE_OTHER): Payer: BC Managed Care – PPO

## 2020-10-07 DIAGNOSIS — E785 Hyperlipidemia, unspecified: Secondary | ICD-10-CM | POA: Diagnosis not present

## 2020-10-07 DIAGNOSIS — D649 Anemia, unspecified: Secondary | ICD-10-CM

## 2020-10-07 DIAGNOSIS — I10 Essential (primary) hypertension: Secondary | ICD-10-CM

## 2020-10-07 LAB — LIPID PANEL
Cholesterol: 148 mg/dL (ref 0–200)
HDL: 60.3 mg/dL (ref >39.00)
LDL Cholesterol: 76 mg/dL (ref 0–99)
NonHDL: 87.85
Total CHOL/HDL Ratio: 2
Triglycerides: 57 mg/dL (ref 0.0–149.0)
VLDL: 11.4 mg/dL (ref 0.0–40.0)

## 2020-10-07 LAB — URINALYSIS, ROUTINE W REFLEX MICROSCOPIC
Bilirubin Urine: NEGATIVE
Hgb urine dipstick: NEGATIVE
Ketones, ur: NEGATIVE
Nitrite: NEGATIVE
Specific Gravity, Urine: <=1.005 — AB (ref 1.000–1.030)
Total Protein, Urine: NEGATIVE
Urine Glucose: NEGATIVE
Urobilinogen, UA: 0.2 (ref 0.0–1.0)
pH: 6.5 (ref 5.0–8.0)

## 2020-10-07 LAB — B12 AND FOLATE PANEL
Folate: 10.7 ng/mL (ref >5.9)
Vitamin B-12: 273 pg/mL (ref 211–911)

## 2020-10-07 LAB — IBC + FERRITIN
Ferritin: 4.4 ng/mL — ABNORMAL LOW (ref 10.0–291.0)
Iron: 27 ug/dL — ABNORMAL LOW (ref 42–145)
Saturation Ratios: 5.5 % — ABNORMAL LOW (ref 20.0–50.0)
TIBC: 488.6 ug/dL — ABNORMAL HIGH (ref 250.0–450.0)
Transferrin: 349 mg/dL (ref 212.0–360.0)

## 2020-10-07 LAB — BASIC METABOLIC PANEL
BUN: 10 mg/dL (ref 6–23)
CO2: 28 mEq/L (ref 19–32)
Calcium: 9.8 mg/dL (ref 8.4–10.5)
Chloride: 103 mEq/L (ref 96–112)
Creatinine, Ser: 0.94 mg/dL (ref 0.40–1.20)
GFR: 72.76 mL/min (ref >60.00)
Glucose, Bld: 93 mg/dL (ref 70–99)
Potassium: 4.2 mEq/L (ref 3.5–5.1)
Sodium: 139 mEq/L (ref 135–145)

## 2020-10-07 NOTE — Telephone Encounter (Signed)
See result note message 

## 2020-10-11 LAB — CELIAC DISEASE AB SCREEN W/RFX
Antigliadin Abs, IgA: 4 units (ref 0–19)
IgA/Immunoglobulin A, Serum: 192 mg/dL (ref 87–352)
Transglutaminase IgA: <2 U/mL (ref 0–3)

## 2020-10-11 LAB — ANTI-PARIETAL ANTIBODY: PARIETAL CELL AB SCREEN: NEGATIVE

## 2020-10-11 LAB — HOMOCYSTEINE: Homocysteine: 8.9 umol/L (ref <10.4)

## 2020-10-11 LAB — METHYLMALONIC ACID, SERUM: Methylmalonic Acid, Quant: 133 nmol/L (ref 87–318)

## 2020-10-11 LAB — INTRINSIC FACTOR ANTIBODIES: Intrinsic Factor: NEGATIVE

## 2020-11-28 ENCOUNTER — Ambulatory Visit: Payer: BC Managed Care – PPO | Admitting: Family

## 2020-11-30 ENCOUNTER — Encounter: Payer: Self-pay | Admitting: Family

## 2020-12-02 ENCOUNTER — Other Ambulatory Visit: Payer: Self-pay | Admitting: Family

## 2020-12-02 DIAGNOSIS — F4010 Social phobia, unspecified: Secondary | ICD-10-CM

## 2020-12-02 DIAGNOSIS — F5101 Primary insomnia: Secondary | ICD-10-CM

## 2020-12-02 MED ORDER — DOXEPIN HCL 10 MG PO CAPS: 20.0000 mg | ORAL_CAPSULE | Freq: Every evening | ORAL | 1 refills | Status: DC | PRN

## 2020-12-03 ENCOUNTER — Other Ambulatory Visit: Payer: Self-pay | Admitting: Family

## 2020-12-03 DIAGNOSIS — J309 Allergic rhinitis, unspecified: Secondary | ICD-10-CM

## 2020-12-16 ENCOUNTER — Other Ambulatory Visit: Payer: Self-pay | Admitting: Family

## 2020-12-16 DIAGNOSIS — I10 Essential (primary) hypertension: Secondary | ICD-10-CM

## 2020-12-16 DIAGNOSIS — G43009 Migraine without aura, not intractable, without status migrainosus: Secondary | ICD-10-CM

## 2021-01-09 ENCOUNTER — Ambulatory Visit: Payer: BC Managed Care – PPO | Admitting: Family

## 2021-01-16 ENCOUNTER — Ambulatory Visit: Payer: BC Managed Care – PPO | Admitting: Family

## 2021-01-16 ENCOUNTER — Encounter: Payer: Self-pay | Admitting: Family

## 2021-01-16 ENCOUNTER — Other Ambulatory Visit: Payer: Self-pay

## 2021-01-16 VITALS — BP 118/76 | HR 73 | Temp 98.5°F | Ht 63.0 in | Wt 160.8 lb

## 2021-01-16 DIAGNOSIS — D649 Anemia, unspecified: Secondary | ICD-10-CM | POA: Diagnosis not present

## 2021-01-16 DIAGNOSIS — G43009 Migraine without aura, not intractable, without status migrainosus: Secondary | ICD-10-CM | POA: Diagnosis not present

## 2021-01-16 DIAGNOSIS — I10 Essential (primary) hypertension: Secondary | ICD-10-CM | POA: Diagnosis not present

## 2021-01-16 DIAGNOSIS — E785 Hyperlipidemia, unspecified: Secondary | ICD-10-CM

## 2021-01-16 NOTE — Progress Notes (Signed)
Subjective:    Patient ID: Heather [name], female    DOB: [birth year], 45 y.o.   MRN: [id number]  CC: [name] is a 45 y.o. female who presents today for follow up.   HPI: Compliant with ferrous sulfate 325mg  QD.She has heavy periods, no clots. Menses are monthly.  She is not on birth control. No menstrual pain, sob, fatigue. H/o fibroid.    She declines colonoscopy  HTN- compliant with hctz 12.5mg , toprol 100mg  HLD-compliant with Crestor 20 mg HA - she hasnt had a HA in month which is good for her.  Headache feels similar to prior headaches.  Maxalt works well . No vision loss, vision changes.   No known h/o of atherosclerosis.   HISTORY:  Past Medical History:  Diagnosis Date   Hyperlipidemia    Hypertension    Nail fungus 06.14.2014   Past Surgical History:  Procedure Laterality Date   VAGINAL DELIVERY     Family History  Problem Relation Age of Onset   Hypertension Mother    Insomnia Mother    Heart disease Father    Heart disease Maternal Grandmother    Heart disease Maternal Grandfather    CVA Paternal Grandmother    Breast cancer Neg Hx    Allergic rhinitis Neg Hx    Eczema Neg Hx    Asthma Neg Hx    Urticaria Neg Hx    Thyroid cancer Neg Hx     Allergies: Gluten meal Current Outpatient Medications on File Prior to Visit  Medication Sig Dispense Refill   Diclofenac Sodium 2 % SOLN Place onto the skin.     doxepin (SINEQUAN) 10 MG capsule Take 2 capsules (20 mg total) by mouth at bedtime as needed. 120 capsule 1   hydrochlorothiazide (HYDRODIURIL) 12.5 MG tablet Take 1 tablet (12.5 mg total) by mouth daily. 90 tablet 1   ipratropium (ATROVENT) 0.06 % nasal spray USE 2 SPRAY(S) IN EACH NOSTRIL 4 TIMES DAILY 15 mL 1   metoprolol succinate (TOPROL-XL) 100 MG 24 hr tablet TAKE 1 TABLET BY MOUTH ONCE DAILY WITH  OR  IMMEDIATELY  FOLLOWING  A  MEAL 90 tablet 0   rizatriptan (MAXALT) 10 MG tablet TAKE ONE TABLET BY MOUTH AS NEEDED FOR MIGRAINE. MAY  REPEAT IN TWO HOURS IF NEEDED 10 tablet 2   rosuvastatin (CRESTOR) 20 MG tablet Take 1 tablet by mouth once daily 90 tablet 0   sertraline (ZOLOFT) 25 MG tablet Take 1 tablet (25 mg total) by mouth daily. 90 tablet 0   KLOR-CON M20 20 MEQ tablet Take 20 mEq by mouth daily. (Patient not taking: Reported on 01/16/2021)     No current facility-administered medications on file prior to visit.    Social History   Tobacco Use   Smoking status: Never   Smokeless tobacco: Never  Vaping Use   Vaping Use: Never used  Substance Use Topics   Alcohol use: No   Drug use: No    Review of Systems  Constitutional:  Negative for chills and fever.  Respiratory:  Negative for cough.   Cardiovascular:  Negative for chest pain and palpitations.  Gastrointestinal:  Negative for nausea and vomiting.  Neurological:  Positive for headaches (well controlled).     Objective:    BP 118/76 (BP Location: Left Arm, Patient Position: Sitting, Cuff Size: Normal)   Pulse 73   Temp 98.5 F (36.9 C) (Oral)   Ht 5\' 3"  (1.6 m)   Wt 160 lb  12.8 oz (72.9 kg)   SpO2 96%   BMI 28.48 kg/m  BP Readings from Last 3 Encounters:  01/16/21 118/76  09/28/20 (!) 88/60  09/10/19 110/70   Wt Readings from Last 3 Encounters:  01/16/21 160 lb 12.8 oz (72.9 kg)  09/28/20 159 lb (72.1 kg)  12/11/19 140 lb (63.5 kg)    Physical Exam Vitals reviewed.  Constitutional:      Appearance: She is well-developed.  Eyes:     Conjunctiva/sclera: Conjunctivae normal.  Cardiovascular:     Rate and Rhythm: Normal rate and regular rhythm.     Pulses: Normal pulses.     Heart sounds: Normal heart sounds.  Pulmonary:     Effort: Pulmonary effort is normal.     Breath sounds: Normal breath sounds. No wheezing, rhonchi or rales.  Skin:    General: Skin is warm and dry.  Neurological:     Mental Status: She is alert.  Psychiatric:        Speech: Speech normal.        Behavior: Behavior normal.        Thought Content:  Thought content normal.       Assessment & Plan:   Problem List Items Addressed This Visit       Cardiovascular and Mediastinum   Hypertension    Chronic, stable.  Continue hydrochlorothiazide 12.5 mg, Toprol 100 mg      Migraine    Chronic, stable.  Headaches are similar to headaches in the past.  Continue Maxalt 10 prn .        Other   Anemia - Primary    Consistent with iron deficient anemia.  Anemia has resolved. She declines colonoscopy, pelvic US ( h/o fibroids) at this time.  Advised that she may continue diet rich in iron but she may stop iron supplementation.      Relevant Orders   CBC with Differential/Platelet (Completed)   IBC + Ferritin (Completed)   Hyperlipidemia    Chronic, stable.  Continue Crestor 20 mg        I have discontinued Rolm Bookbinder. I am also having her maintain her Diclofenac Sodium, Klor-Con M20, rizatriptan, sertraline, hydrochlorothiazide, doxepin, ipratropium, metoprolol succinate, and rosuvastatin.   No orders of the defined types were placed in this encounter.   Return precautions given.   Risks, benefits, and alternatives of the medications and treatment plan prescribed today were discussed, and patient expressed understanding.   Education regarding symptom management and diagnosis given to patient on AVS.  Continue to follow with Allegra Grana, FNP for routine health maintenance.   Heather [name] and I agreed with plan.   Rennie Plowman, FNP

## 2021-01-17 DIAGNOSIS — D649 Anemia, unspecified: Secondary | ICD-10-CM | POA: Insufficient documentation

## 2021-01-17 LAB — CBC WITH DIFFERENTIAL/PLATELET
Basophils Absolute: 0.1 10*3/uL (ref 0.0–0.1)
Basophils Relative: 0.9 % (ref 0.0–3.0)
Eosinophils Absolute: 0.1 10*3/uL (ref 0.0–0.7)
Eosinophils Relative: 1.5 % (ref 0.0–5.0)
HCT: 38.4 % (ref 36.0–46.0)
Hemoglobin: 13.4 g/dL (ref 12.0–15.0)
Lymphocytes Relative: 28.3 % (ref 12.0–46.0)
Lymphs Abs: 2.2 10*3/uL (ref 0.7–4.0)
MCHC: 34.9 g/dL (ref 30.0–36.0)
MCV: 84.9 fl (ref 78.0–100.0)
Monocytes Absolute: 0.5 10*3/uL (ref 0.1–1.0)
Monocytes Relative: 6.7 % (ref 3.0–12.0)
Neutro Abs: 4.9 10*3/uL (ref 1.4–7.7)
Neutrophils Relative %: 62.6 % (ref 43.0–77.0)
Platelets: 234 10*3/uL (ref 150.0–400.0)
RBC: 4.53 Mil/uL (ref 3.87–5.11)
RDW: 17.7 % — ABNORMAL HIGH (ref 11.5–15.5)
WBC: 7.9 10*3/uL (ref 4.0–10.5)

## 2021-01-17 LAB — IBC + FERRITIN
Ferritin: 22.6 ng/mL (ref 10.0–291.0)
Iron: 93 ug/dL (ref 42–145)
Saturation Ratios: 26.5 % (ref 20.0–50.0)
TIBC: 351.4 ug/dL (ref 250.0–450.0)
Transferrin: 251 mg/dL (ref 212.0–360.0)

## 2021-01-17 NOTE — Assessment & Plan Note (Signed)
Chronic, stable.  Continue Crestor 20 mg 

## 2021-01-17 NOTE — Assessment & Plan Note (Addendum)
Consistent with iron deficient anemia.  Anemia has resolved. She declines colonoscopy, pelvic US ( h/o fibroids) at this time.  Advised that she may continue diet rich in iron but she may stop iron supplementation.

## 2021-01-17 NOTE — Assessment & Plan Note (Addendum)
Chronic, stable.  Continue hydrochlorothiazide 12.5 mg, Toprol 100 mg

## 2021-01-17 NOTE — Assessment & Plan Note (Signed)
Chronic, stable.  Headaches are similar to headaches in the past.  Continue Maxalt 10 prn .

## 2021-01-23 ENCOUNTER — Telehealth: Payer: Self-pay | Admitting: Family

## 2021-01-23 NOTE — Telephone Encounter (Signed)
-----   Message from Lourena Simmonds, RPH-CPP sent at 01/16/2021  5:07 PM EST ----- I strongly advise against any supplements, because we can't guarantee that any of those "proprietary" stuff doesn't interact.   Catie ----- Message ----- From: Allegra Grana, FNP Sent: 01/16/2021   4:24 PM EST To: Lourena Simmonds, RPH-CPP  Do you have any concerns with patient starting GoLo supplement for weight loss?  It has zinc, magnesium and long list of proprietary ones.  Does this interfere with crestor ? That is always my concern  Thank you so much !

## 2021-01-23 NOTE — Telephone Encounter (Signed)
Call pt As FYI I did consult with pharmacist on staff regarding GoLo as we dont know if proprietary ingredients interact with medications. We dont have the data so to err on side of caution, she should consider stopping GoLo.

## 2021-01-25 NOTE — Telephone Encounter (Signed)
Pt called and made aware that not enough is known about the [facility] for patient to continue. To err on the side of caution she should stop.

## 2021-03-10 ENCOUNTER — Other Ambulatory Visit: Payer: Self-pay | Admitting: Family

## 2021-03-10 ENCOUNTER — Other Ambulatory Visit: Payer: Self-pay | Admitting: Internal Medicine

## 2021-03-10 DIAGNOSIS — I10 Essential (primary) hypertension: Secondary | ICD-10-CM

## 2021-03-10 DIAGNOSIS — G43009 Migraine without aura, not intractable, without status migrainosus: Secondary | ICD-10-CM

## 2021-03-11 ENCOUNTER — Encounter: Payer: Self-pay | Admitting: Family

## 2021-03-15 ENCOUNTER — Other Ambulatory Visit: Payer: Self-pay | Admitting: Family

## 2021-03-15 DIAGNOSIS — R635 Abnormal weight gain: Secondary | ICD-10-CM

## 2021-03-15 MED ORDER — WEGOVY 0.5 MG/0.5ML ~~LOC~~ SOAJ: 0.5000 mg | SUBCUTANEOUS | 1 refills | Status: DC

## 2021-03-29 ENCOUNTER — Other Ambulatory Visit: Payer: Self-pay | Admitting: Family

## 2021-03-29 DIAGNOSIS — F4010 Social phobia, unspecified: Secondary | ICD-10-CM

## 2021-03-29 DIAGNOSIS — F5101 Primary insomnia: Secondary | ICD-10-CM

## 2021-04-19 ENCOUNTER — Ambulatory Visit: Payer: BC Managed Care – PPO | Admitting: Family

## 2021-05-12 ENCOUNTER — Other Ambulatory Visit: Payer: Self-pay | Admitting: Family

## 2021-05-12 DIAGNOSIS — J309 Allergic rhinitis, unspecified: Secondary | ICD-10-CM

## 2021-05-12 DIAGNOSIS — R635 Abnormal weight gain: Secondary | ICD-10-CM

## 2021-06-01 ENCOUNTER — Other Ambulatory Visit: Payer: Self-pay | Admitting: Family

## 2021-06-01 DIAGNOSIS — F4010 Social phobia, unspecified: Secondary | ICD-10-CM

## 2021-06-01 DIAGNOSIS — F5101 Primary insomnia: Secondary | ICD-10-CM

## 2021-06-09 ENCOUNTER — Other Ambulatory Visit: Payer: Self-pay | Admitting: Family

## 2021-06-09 DIAGNOSIS — F5101 Primary insomnia: Secondary | ICD-10-CM

## 2021-06-09 DIAGNOSIS — F4010 Social phobia, unspecified: Secondary | ICD-10-CM

## 2021-06-13 ENCOUNTER — Telehealth: Payer: Self-pay

## 2021-06-13 NOTE — Telephone Encounter (Signed)
Received request on my cover my meds for patient. Think she is seen by Claris Che. Not sure who is working with her. Can you send to them to have completed?  ? ? ?

## 2021-06-17 ENCOUNTER — Other Ambulatory Visit: Payer: Self-pay | Admitting: Family

## 2021-06-17 DIAGNOSIS — G43009 Migraine without aura, not intractable, without status migrainosus: Secondary | ICD-10-CM

## 2021-06-17 DIAGNOSIS — F5101 Primary insomnia: Secondary | ICD-10-CM

## 2021-06-17 DIAGNOSIS — I10 Essential (primary) hypertension: Secondary | ICD-10-CM

## 2021-06-17 DIAGNOSIS — F4010 Social phobia, unspecified: Secondary | ICD-10-CM

## 2021-06-19 ENCOUNTER — Telehealth: Payer: Self-pay

## 2021-06-19 NOTE — Telephone Encounter (Signed)
Spoke to patient to inform her that her Reginal Lutes was denied . Patient stated she would like to try something else. Patient would like to know what you could recommend. ?

## 2021-06-19 NOTE — Telephone Encounter (Signed)
LM FOR PT TO [facility] : NEEDS APPOINTMENT ?

## 2021-06-20 NOTE — Telephone Encounter (Signed)
Spoke to patient about coming in to discuss possible solutions besides wegovy scheduled appointment for 07/10/21 ?

## 2021-06-27 ENCOUNTER — Other Ambulatory Visit: Payer: Self-pay | Admitting: Family

## 2021-06-27 DIAGNOSIS — F4010 Social phobia, unspecified: Secondary | ICD-10-CM

## 2021-06-27 DIAGNOSIS — F5101 Primary insomnia: Secondary | ICD-10-CM

## 2021-07-10 ENCOUNTER — Encounter: Payer: Self-pay | Admitting: Family

## 2021-07-10 ENCOUNTER — Ambulatory Visit: Payer: BC Managed Care – PPO | Admitting: Family

## 2021-07-10 VITALS — BP 116/68 | HR 69 | Temp 98.6°F | Ht 63.0 in | Wt 149.0 lb

## 2021-07-10 DIAGNOSIS — F4010 Social phobia, unspecified: Secondary | ICD-10-CM

## 2021-07-10 DIAGNOSIS — R635 Abnormal weight gain: Secondary | ICD-10-CM

## 2021-07-10 DIAGNOSIS — G43009 Migraine without aura, not intractable, without status migrainosus: Secondary | ICD-10-CM | POA: Diagnosis not present

## 2021-07-10 DIAGNOSIS — F5101 Primary insomnia: Secondary | ICD-10-CM

## 2021-07-10 DIAGNOSIS — I10 Essential (primary) hypertension: Secondary | ICD-10-CM | POA: Diagnosis not present

## 2021-07-10 MED ORDER — METFORMIN HCL ER 500 MG PO TB24: 500.0000 mg | ORAL_TABLET | Freq: Every evening | ORAL | 2 refills | Status: DC

## 2021-07-10 MED ORDER — DOXEPIN HCL 10 MG PO CAPS: ORAL_CAPSULE | ORAL | 3 refills | Status: DC

## 2021-07-10 MED ORDER — HYDROCHLOROTHIAZIDE 12.5 MG PO TABS: 12.5000 mg | ORAL_TABLET | Freq: Every day | ORAL | 1 refills | Status: DC

## 2021-07-10 MED ORDER — RIZATRIPTAN BENZOATE 10 MG PO TABS: ORAL_TABLET | ORAL | 2 refills | Status: DC

## 2021-07-10 MED ORDER — METOPROLOL SUCCINATE ER 100 MG PO TB24: ORAL_TABLET | ORAL | 3 refills | Status: DC

## 2021-07-10 MED ORDER — ROSUVASTATIN CALCIUM 20 MG PO TABS: 20.0000 mg | ORAL_TABLET | Freq: Every day | ORAL | 3 refills | Status: DC

## 2021-07-10 NOTE — Patient Instructions (Signed)
Lets trial metformin  Start metformin XR with one 500mg tablet at night. After one week, you may increase to two tablets at night ( total of 1000mg) . The third week, you may take take two tablets at night and one tablet in the morning.  The fourth week, you may take two tablets in the morning ( 1000mg total) and two tablets at night (1000mg total). This will bring you to a maximum daily dose of 2000mg/day which is maximum dose. Along the way, if you want to increase more slowly, please do as this medication can cause GI discomfort and loose stools which usually get better with time , however some patients find that they can only tolerate a certain dose and cannot increase to maximum dose.   

## 2021-07-10 NOTE — Progress Notes (Signed)
Subjective:    Patient ID: Heather [name], female    DOB: 02/09/1976, 46 y.o.   MRN: [id number]  CC: Heather Nosemanda G Jahnke is a 46 y.o. female who presents today for follow up.   HPI: Feels well today.  No new complaints.  She would like medication refills of all of her medications .   she is frustrated as [employer] officer[employer]urance would not cover [id number].  She  lost approximately 10 pounds on medication and tolerated it well.  She would like to discuss medications for weight loss.  Reginal LutesWegovy was denied by insurance.   History of prediabetes   Tried saxenda, victoza without results Trial of wellbutrin she thinks for depression however doesn't recall results  HTN- compliant with hctz 12.5mg , toprol 100mg .  Migraine- Unchanged from prior.  compliant with maxalt 10mg .  Headaches are unchanged and only occur around her menses.  She would like a refill of Maxalt  HISTORY:  Past Medical History:  Diagnosis Date   Hyperlipidemia    Hypertension    Nail fungus 06.14.2014   Past Surgical History:  Procedure Laterality Date   VAGINAL DELIVERY     Family History  Problem Relation Age of Onset   Hypertension Mother    Insomnia Mother    Heart disease Father    Heart disease Maternal Grandmother    Heart disease Maternal Grandfather    CVA Paternal Grandmother    Breast cancer Neg Hx    Allergic rhinitis Neg Hx    Eczema Neg Hx    Asthma Neg Hx    Urticaria Neg Hx    Thyroid cancer Neg Hx     Allergies: Gluten meal Current Outpatient Medications on File Prior to Visit  Medication Sig Dispense Refill   Diclofenac Sodium 2 % SOLN Place onto the skin.     ipratropium (ATROVENT) 0.06 % nasal spray USE 2 SPRAY(S) IN EACH NOSTRIL 4 TIMES DAILY 15 mL 0   No current facility-administered medications on file prior to visit.    Social History   Tobacco Use   Smoking status: Never   Smokeless tobacco: Never  Vaping Use   Vaping Use: Never used  Substance Use Topics   Alcohol use: No   Drug  use: No    Review of Systems  Constitutional:  Negative for chills and fever.  Respiratory:  Negative for cough.   Cardiovascular:  Negative for chest pain and palpitations.  Gastrointestinal:  Negative for nausea and vomiting.     Objective:    BP 116/68 (BP Location: Left Arm, Patient Position: Sitting, Cuff Size: Normal)   Pulse 69   Temp 98.6 F (37 C) (Oral)   Ht 5\' 3"  (1.6 m)   Wt 149 lb (67.6 kg)   SpO2 98%   BMI 26.39 kg/m  BP Readings from Last 3 Encounters:  07/10/21 116/68  01/16/21 118/76  09/28/20 (!) 88/60   Wt Readings from Last 3 Encounters:  07/10/21 149 lb (67.6 kg)  01/16/21 160 lb 12.8 oz (72.9 kg)  09/28/20 159 lb (72.1 kg)    Physical Exam Vitals reviewed.  Constitutional:      Appearance: She is well-developed.  Eyes:     Conjunctiva/sclera: Conjunctivae normal.  Cardiovascular:     Rate and Rhythm: Normal rate and regular rhythm.     Pulses: Normal pulses.     Heart sounds: Normal heart sounds.  Pulmonary:     Effort: Pulmonary effort is normal.     Breath sounds: Normal  breath sounds. No wheezing, rhonchi or rales.  Skin:    General: Skin is warm and dry.  Neurological:     Mental Status: She is alert.  Psychiatric:        Speech: Speech normal.        Behavior: Behavior normal.        Thought Content: Thought content normal.       Assessment & Plan:   Problem List Items Addressed This Visit       Cardiovascular and Mediastinum   Hypertension    Chronic, stable.  Continue hctz 12.5mg , toprol 100mg .       Relevant Medications   hydrochlorothiazide (HYDRODIURIL) 12.5 MG tablet   metoprolol succinate (TOPROL-XL) 100 MG 24 hr tablet   rosuvastatin (CRESTOR) 20 MG tablet   Migraine    Chronic, stable. Unchanged from prior.  Continue as needed Maxalt 10 mg       Relevant Medications   hydrochlorothiazide (HYDRODIURIL) 12.5 MG tablet   metoprolol succinate (TOPROL-XL) 100 MG 24 hr tablet   rizatriptan (MAXALT) 10 MG  tablet   doxepin (SINEQUAN) 10 MG capsule   rosuvastatin (CRESTOR) 20 MG tablet     Endocrine   History of prediabetes - Primary    Pending A1c.  Insurance would not cover .  Trial start metformin; patient will titrate.         Other   Primary insomnia   Relevant Medications   doxepin (SINEQUAN) 10 MG capsule   Social anxiety disorder   Relevant Medications   doxepin (SINEQUAN) 10 MG capsule   Other Visit Diagnoses     Essential hypertension       Relevant Medications   hydrochlorothiazide (HYDRODIURIL) 12.5 MG tablet   metoprolol succinate (TOPROL-XL) 100 MG 24 hr tablet   rosuvastatin (CRESTOR) 20 MG tablet   Other Relevant Orders   Comprehensive metabolic panel        I have discontinued Solveig G. Higham's Klor-Con M20, sertraline, and Wegovy. I have also changed her hydrochlorothiazide and rosuvastatin. Additionally, I am having her start on metFORMIN. Lastly, I am having her maintain her Diclofenac Sodium, ipratropium, metoprolol succinate, rizatriptan, and doxepin.   Meds ordered this encounter  Medications   hydrochlorothiazide (HYDRODIURIL) 12.5 MG tablet    Sig: Take 1 tablet (12.5 mg total) by mouth daily.    Dispense:  90 tablet    Refill:  1    PT NEEDS APPOINTMENT    Order Specific Question:   Supervising Provider    Answer:   [employer] L [[id number]]   metoprolol succinate (TOPROL-XL) 100 MG 24 hr tablet    Sig: TAKE 1 TABLET BY MOUTH ONCE DAILY (TAKE  IMMEDIATELY  FOLLOWING  A  MEAL)    Dispense:  90 tablet    Refill:  3    PT NEEDS APPOINTMENT    Order Specific Question:   Supervising Provider    Answer:   Duncan Dull [[id number]]   rizatriptan (MAXALT) 10 MG tablet    Sig: TAKE 1 TABLET BY MOUTH AS NEEDED FOR MIGRAINE - MAY REPEAT IN 2 HOURS IF NEEDED    Dispense:  10 tablet    Refill:  2    Order Specific Question:   Supervising Provider    Answer:   Sherlene Shams [[id number]]   doxepin (SINEQUAN) 10 MG capsule    Sig: TAKE 2 CAPSULES BY  MOUTH AT BEDTIME AS NEEDED    Dispense:  180 capsule  Refill:  3    Order Specific Question:   Supervising Provider    Answer:   Duncan Dull L [[id number]]   rosuvastatin (CRESTOR) 20 MG tablet    Sig: Take 1 tablet (20 mg total) by mouth daily.    Dispense:  90 tablet    Refill:  3    PT NEEDS APPOINTMENT    Order Specific Question:   Supervising Provider    Answer:   Duncan Dull L [[id number]]   metFORMIN (GLUCOPHAGE-XR) 500 MG 24 hr tablet    Sig: Take 1 tablet (500 mg total) by mouth every evening.    Dispense:  90 tablet    Refill:  2    Order Specific Question:   Supervising Provider    Answer:   Sherlene Shams [[id number]]    Return precautions given.   Risks, benefits, and alternatives of the medications and treatment plan prescribed today were discussed, and patient expressed understanding.   Education regarding symptom management and diagnosis given to patient on AVS.  Continue to follow with Allegra Grana, FNP for routine health maintenance.   Heather Nose and I agreed with plan.   Rennie Plowman, FNP

## 2021-07-10 NOTE — Assessment & Plan Note (Signed)
Chronic, stable. Unchanged from prior.  Continue as needed Maxalt 10 mg

## 2021-07-10 NOTE — Assessment & Plan Note (Addendum)
Pending A1c.  Insurance would not cover [employer].  Trial start metformin; patient will titrate.

## 2021-07-10 NOTE — Assessment & Plan Note (Signed)
Chronic, stable.  Continue hctz 12.5mg , toprol 100mg .

## 2021-07-11 ENCOUNTER — Other Ambulatory Visit: Payer: Self-pay | Admitting: Family

## 2021-07-11 DIAGNOSIS — R899 Unspecified abnormal finding in specimens from other organs, systems and tissues: Secondary | ICD-10-CM

## 2021-07-11 LAB — COMPREHENSIVE METABOLIC PANEL
ALT: 13 U/L (ref 0–35)
AST: 17 U/L (ref 0–37)
Albumin: 4.7 g/dL (ref 3.5–5.2)
Alkaline Phosphatase: 49 U/L (ref 39–117)
BUN: 11 mg/dL (ref 6–23)
CO2: 30 mEq/L (ref 19–32)
Calcium: 10.3 mg/dL (ref 8.4–10.5)
Chloride: 99 mEq/L (ref 96–112)
Creatinine, Ser: 0.84 mg/dL (ref 0.40–1.20)
GFR: 82.83 mL/min (ref >60.00)
Glucose, Bld: 84 mg/dL (ref 70–99)
Potassium: 4.1 mEq/L (ref 3.5–5.1)
Sodium: 136 mEq/L (ref 135–145)
Total Bilirubin: 1.3 mg/dL — ABNORMAL HIGH (ref 0.2–1.2)
Total Protein: 7.2 g/dL (ref 6.0–8.3)

## 2021-07-11 LAB — HEMOGLOBIN A1C: Hgb A1c MFr Bld: 5.2 % (ref 4.6–6.5)

## 2021-07-28 ENCOUNTER — Other Ambulatory Visit: Payer: Self-pay | Admitting: Family

## 2021-07-28 DIAGNOSIS — J309 Allergic rhinitis, unspecified: Secondary | ICD-10-CM

## 2021-08-08 ENCOUNTER — Other Ambulatory Visit: Payer: Self-pay

## 2021-08-20 ENCOUNTER — Other Ambulatory Visit: Payer: Self-pay | Admitting: Family

## 2021-08-20 ENCOUNTER — Encounter: Payer: Self-pay | Admitting: Family

## 2021-08-20 DIAGNOSIS — J309 Allergic rhinitis, unspecified: Secondary | ICD-10-CM

## 2021-08-21 MED ORDER — IPRATROPIUM BROMIDE 0.06 % NA SOLN: 2.0000 | Freq: Four times a day (QID) | NASAL | 0 refills | Status: DC

## 2021-08-21 NOTE — Telephone Encounter (Signed)
It is fine to write the prescription for metformin XR 2000 mg PO daily.

## 2021-09-01 ENCOUNTER — Ambulatory Visit: Payer: BC Managed Care – PPO | Admitting: Family

## 2021-09-01 ENCOUNTER — Encounter: Payer: Self-pay | Admitting: Family

## 2021-09-01 VITALS — BP 126/76 | HR 71 | Temp 98.0°F | Ht 63.0 in | Wt 153.0 lb

## 2021-09-01 DIAGNOSIS — Z87898 Personal history of other specified conditions: Secondary | ICD-10-CM | POA: Diagnosis not present

## 2021-09-01 DIAGNOSIS — G43009 Migraine without aura, not intractable, without status migrainosus: Secondary | ICD-10-CM | POA: Diagnosis not present

## 2021-09-01 DIAGNOSIS — G43809 Other migraine, not intractable, without status migrainosus: Secondary | ICD-10-CM

## 2021-09-01 DIAGNOSIS — R899 Unspecified abnormal finding in specimens from other organs, systems and tissues: Secondary | ICD-10-CM | POA: Diagnosis not present

## 2021-09-01 LAB — CBC WITH DIFFERENTIAL/PLATELET
Basophils Absolute: 0 10*3/uL (ref 0.0–0.1)
Basophils Relative: 0.6 % (ref 0.0–3.0)
Eosinophils Absolute: 0.2 10*3/uL (ref 0.0–0.7)
Eosinophils Relative: 2.4 % (ref 0.0–5.0)
HCT: 41.6 % (ref 36.0–46.0)
Hemoglobin: 14.2 g/dL (ref 12.0–15.0)
Lymphocytes Relative: 36.7 % (ref 12.0–46.0)
Lymphs Abs: 2.6 10*3/uL (ref 0.7–4.0)
MCHC: 34.1 g/dL (ref 30.0–36.0)
MCV: 90.4 fl (ref 78.0–100.0)
Monocytes Absolute: 0.5 10*3/uL (ref 0.1–1.0)
Monocytes Relative: 6.5 % (ref 3.0–12.0)
Neutro Abs: 3.8 10*3/uL (ref 1.4–7.7)
Neutrophils Relative %: 53.8 % (ref 43.0–77.0)
Platelets: 242 10*3/uL (ref 150.0–400.0)
RBC: 4.61 Mil/uL (ref 3.87–5.11)
RDW: 12.7 % (ref 11.5–15.5)
WBC: 7.1 10*3/uL (ref 4.0–10.5)

## 2021-09-01 MED ORDER — KETOROLAC TROMETHAMINE 60 MG/2ML IM SOLN
60.0000 mg | Freq: Once | INTRAMUSCULAR | Status: AC
  Administered 2021-09-01: 60 mg via INTRAMUSCULAR

## 2021-09-01 MED ORDER — METFORMIN HCL ER 500 MG PO TB24: 2000.0000 mg | ORAL_TABLET | Freq: Every morning | ORAL | 2 refills | Status: DC

## 2021-09-01 NOTE — Assessment & Plan Note (Signed)
Tolerating metformin.  Continue metformin XR 2000 mg once daily

## 2021-09-01 NOTE — Progress Notes (Signed)
Subjective:    Patient ID: Heather [name], female    DOB: 1975-04-12, 46 y.o.   MRN: [id number]  CC: Heather [name] is a 46 y.o. female who presents today for follow up.   HPI: She has HA x 3 days on right temporal. Typical presentation for her HA and reminds her of similar HAs. She hasnt had ha for a couple for months. HA are menstrual related and she expects menses next week No vomiting, photophobia, vision loss.  Not worse HA of life.   She has been taking maxalt 10mg  with temporary relief. She is taking excredin.  She drinks one diet pepsi daily.  Headache is not positional nor is it the worst headache of her life.  Headache not worsened by Valsalva coughing or intercourse.     No h/o PUD, GIB No history of atherosclerosis, ASCVD   Follow-up weight loss.  She is been taking metformin 2000 mg once daily.  Tolerating medication.  Requests refill today HISTORY:  Past Medical History:  Diagnosis Date   Hyperlipidemia    Hypertension    Nail fungus 06.14.2014   Past Surgical History:  Procedure Laterality Date   VAGINAL DELIVERY     Family History  Problem Relation Age of Onset   Hypertension Mother    Insomnia Mother    Heart disease Father    Heart disease Maternal Grandmother    Heart disease Maternal Grandfather    CVA Paternal Grandmother    Breast cancer Neg Hx    Allergic rhinitis Neg Hx    Eczema Neg Hx    Asthma Neg Hx    Urticaria Neg Hx    Thyroid cancer Neg Hx     Allergies: Gluten meal Current Outpatient Medications on File Prior to Visit  Medication Sig Dispense Refill   Diclofenac Sodium 2 % SOLN Place onto the skin.     doxepin (SINEQUAN) 10 MG capsule TAKE 2 CAPSULES BY MOUTH AT BEDTIME AS NEEDED 180 capsule 3   hydrochlorothiazide (HYDRODIURIL) 12.5 MG tablet Take 1 tablet (12.5 mg total) by mouth daily. 90 tablet 1   ipratropium (ATROVENT) 0.06 % nasal spray Place 2 sprays into both nostrils 4 (four) times daily. 15 mL 0    metoprolol succinate (TOPROL-XL) 100 MG 24 hr tablet TAKE 1 TABLET BY MOUTH ONCE DAILY (TAKE  IMMEDIATELY  FOLLOWING  A  MEAL) 90 tablet 3   rizatriptan (MAXALT) 10 MG tablet TAKE 1 TABLET BY MOUTH AS NEEDED FOR MIGRAINE - MAY REPEAT IN 2 HOURS IF NEEDED 10 tablet 2   rosuvastatin (CRESTOR) 20 MG tablet Take 1 tablet (20 mg total) by mouth daily. 90 tablet 3   No current facility-administered medications on file prior to visit.    Social History   Tobacco Use   Smoking status: Never   Smokeless tobacco: Never  Vaping Use   Vaping Use: Never used  Substance Use Topics   Alcohol use: No   Drug use: No    Review of Systems  Constitutional:  Negative for chills and fever.  Eyes:  Negative for visual disturbance.  Respiratory:  Negative for cough.   Cardiovascular:  Negative for chest pain and palpitations.  Gastrointestinal:  Negative for nausea and vomiting.  Neurological:  Positive for headaches. Negative for dizziness and numbness.      Objective:    BP 126/76 (BP Location: Left Arm, Patient Position: Sitting, Cuff Size: Normal)   Pulse 71   Temp 98 F (36.7 C) (  Oral)   Ht 5\' 3"  (1.6 m)   Wt 153 lb (69.4 kg)   LMP 08/10/2021 (Exact Date)   SpO2 96%   BMI 27.10 kg/m  BP Readings from Last 3 Encounters:  09/01/21 126/76  07/10/21 116/68  01/16/21 118/76   Wt Readings from Last 3 Encounters:  09/01/21 153 lb (69.4 kg)  07/10/21 149 lb (67.6 kg)  01/16/21 160 lb 12.8 oz (72.9 kg)    Physical Exam Vitals reviewed.  Constitutional:      Appearance: She is well-developed.  HENT:     Mouth/Throat:     Pharynx: Uvula midline.  Eyes:     Conjunctiva/sclera: Conjunctivae normal.     Pupils: Pupils are equal, round, and reactive to light.     Comments: Fundus normal bilaterally.   Cardiovascular:     Rate and Rhythm: Normal rate and regular rhythm.     Pulses: Normal pulses.     Heart sounds: Normal heart sounds.  Pulmonary:     Effort: Pulmonary effort is  normal.     Breath sounds: Normal breath sounds. No wheezing, rhonchi or rales.  Skin:    General: Skin is warm and dry.  Neurological:     Mental Status: She is alert.     Cranial Nerves: No cranial nerve deficit.     Sensory: No sensory deficit.     Deep Tendon Reflexes:     Reflex Scores:      Bicep reflexes are 2+ on the right side and 2+ on the left side.      Patellar reflexes are 2+ on the right side and 2+ on the left side.    Comments: Grip equal and strong bilateral upper extremities. Gait strong and steady. Able to perform rapid alternating movement without difficulty.   Psychiatric:        Speech: Speech normal.        Behavior: Behavior normal.        Thought Content: Thought content normal.        Assessment & Plan:   Problem List Items Addressed This Visit       Cardiovascular and Mediastinum   Migraine    Reassuring neurologic exam.  Headache similar to headaches in the past.  Typically headaches are menstrual related.  Discussed medication overuse headache as she has been using Maxalt 10 mg for the past 3 days and Excedrin.  Given patient 60 mg IM Toradol.  Discussed rescue versus preventative treatment for headache.  If headache does not respond to Toradol, advised to consider preventative medication.  Topamax may be an option.  No history of ASCVD.  We will continue Maxalt 10 mg for rescue for now.  She will let me know how she is doing.        Endocrine   History of prediabetes - Primary    Tolerating metformin.  Continue metformin XR 2000 mg once daily      Relevant Medications   metFORMIN (GLUCOPHAGE-XR) 500 MG 24 hr tablet   Other Visit Diagnoses     Abnormal laboratory test            I have changed Hennessey G. Khun's metFORMIN. I am also having her maintain her Diclofenac Sodium, hydrochlorothiazide, metoprolol succinate, rizatriptan, doxepin, rosuvastatin, and ipratropium. We administered ketorolac.   Meds ordered this encounter   Medications   metFORMIN (GLUCOPHAGE-XR) 500 MG 24 hr tablet    Sig: Take 4 tablets (2,000 mg total) by mouth every morning.  Dispense:  360 tablet    Refill:  2    Order Specific Question:   Supervising Provider    Answer:   Duncan Dull L [[id number]]   ketorolac (TORADOL) injection 60 mg    Return precautions given.   Risks, benefits, and alternatives of the medications and treatment plan prescribed today were discussed, and patient expressed understanding.   Education regarding symptom management and diagnosis given to patient on AVS.  Continue to follow with Allegra Grana, FNP for routine health maintenance.   Heather [name] and I agreed with plan.   Rennie Plowman, FNP

## 2021-09-01 NOTE — Assessment & Plan Note (Addendum)
Reassuring neurologic exam.  Headache similar to headaches in the past.  Typically headaches are menstrual related.  Discussed medication overuse headache as she has been using Maxalt 10 mg for the past 3 days and Excedrin.  Given patient 60 mg IM Toradol.  Discussed rescue versus preventative treatment for headache.  If headache does not respond to Toradol, advised to consider preventative medication.  Topamax may be an option.  No history of ASCVD.  We will continue Maxalt 10 mg for rescue for now.  She will let me know how she is doing.

## 2021-09-01 NOTE — Patient Instructions (Signed)
Let me know how you are doing and if headache persists or certainly if you develop new symptoms

## 2021-09-02 LAB — BILIRUBIN, FRACTIONATED(TOT/DIR/INDIR)
Bilirubin, Direct: 0.3 mg/dL — ABNORMAL HIGH (ref 0.0–0.2)
Indirect Bilirubin: 1.4 mg/dL (calc) — ABNORMAL HIGH (ref 0.2–1.2)
Total Bilirubin: 1.7 mg/dL — ABNORMAL HIGH (ref 0.2–1.2)

## 2021-09-05 ENCOUNTER — Other Ambulatory Visit: Payer: Self-pay | Admitting: Family

## 2021-09-05 DIAGNOSIS — R17 Unspecified jaundice: Secondary | ICD-10-CM

## 2021-09-12 ENCOUNTER — Ambulatory Visit
Admission: RE | Admit: 2021-09-12 | Discharge: 2021-09-12 | Disposition: A | Discharge status: Home / Self Care | Payer: BC Managed Care – PPO | Source: Ambulatory Visit | Attending: Family | Admitting: Family

## 2021-09-12 DIAGNOSIS — R17 Unspecified jaundice: Secondary | ICD-10-CM

## 2021-09-14 NOTE — Telephone Encounter (Signed)
Rx has been sent in. 

## 2021-09-22 ENCOUNTER — Other Ambulatory Visit: Payer: Self-pay | Admitting: Family

## 2021-09-22 DIAGNOSIS — R7989 Other specified abnormal findings of blood chemistry: Secondary | ICD-10-CM | POA: Insufficient documentation

## 2021-12-27 ENCOUNTER — Encounter: Payer: Self-pay | Admitting: Family

## 2022-01-01 ENCOUNTER — Other Ambulatory Visit: Payer: Self-pay | Admitting: Family

## 2022-01-01 DIAGNOSIS — I10 Essential (primary) hypertension: Secondary | ICD-10-CM

## 2022-01-24 ENCOUNTER — Other Ambulatory Visit: Payer: Self-pay | Admitting: Family

## 2022-01-24 DIAGNOSIS — J309 Allergic rhinitis, unspecified: Secondary | ICD-10-CM

## 2022-03-13 IMAGING — MG DIGITAL DIAGNOSTIC BILAT W/ TOMO W/ CAD
6 of 12 series · 6 of 36 positions shown · non-contrast
Comparison: Previous exam(s).

CLINICAL DATA: Follow-up of probably benign focal asymmetry in the
upper right breast.

EXAM:
DIGITAL DIAGNOSTIC BILATERAL MAMMOGRAM WITH TOMOSYNTHESIS AND CAD
TECHNIQUE: Bilateral digital diagnostic mammography and breast tomosynthesis
was performed. The images were evaluated with computer-aided
detection.

[R MLO synth-2D (1 of 2)]
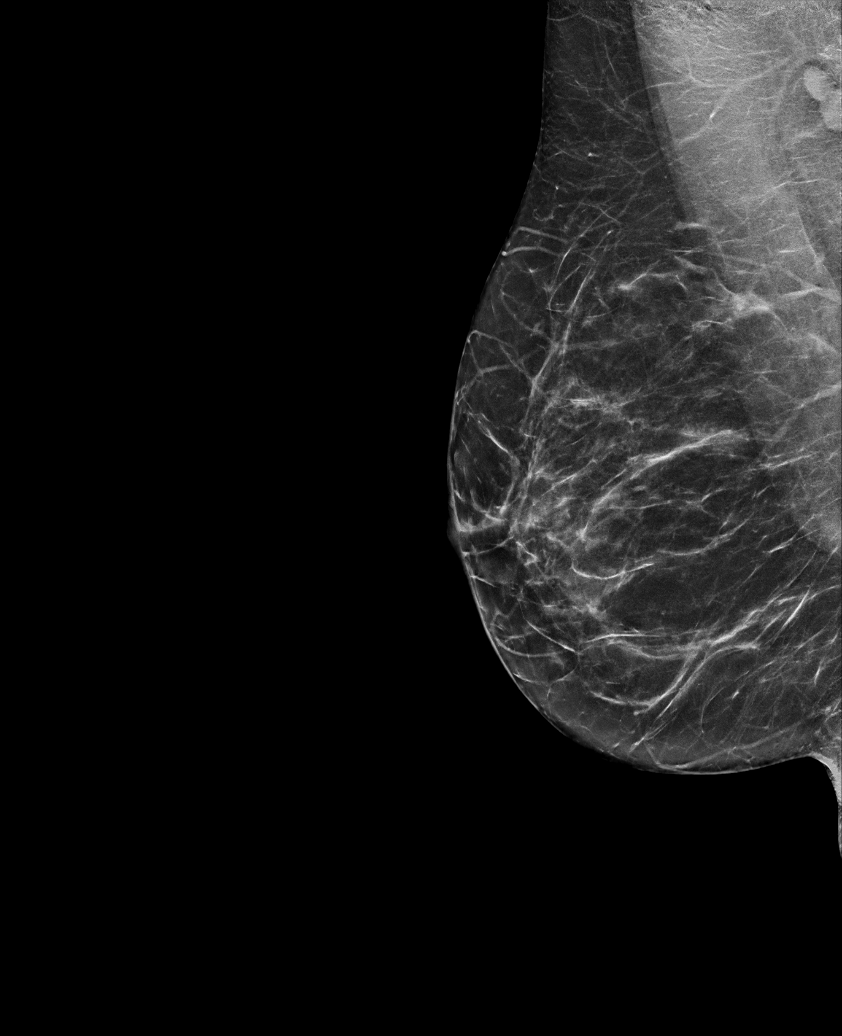

[R ML synth-2D]
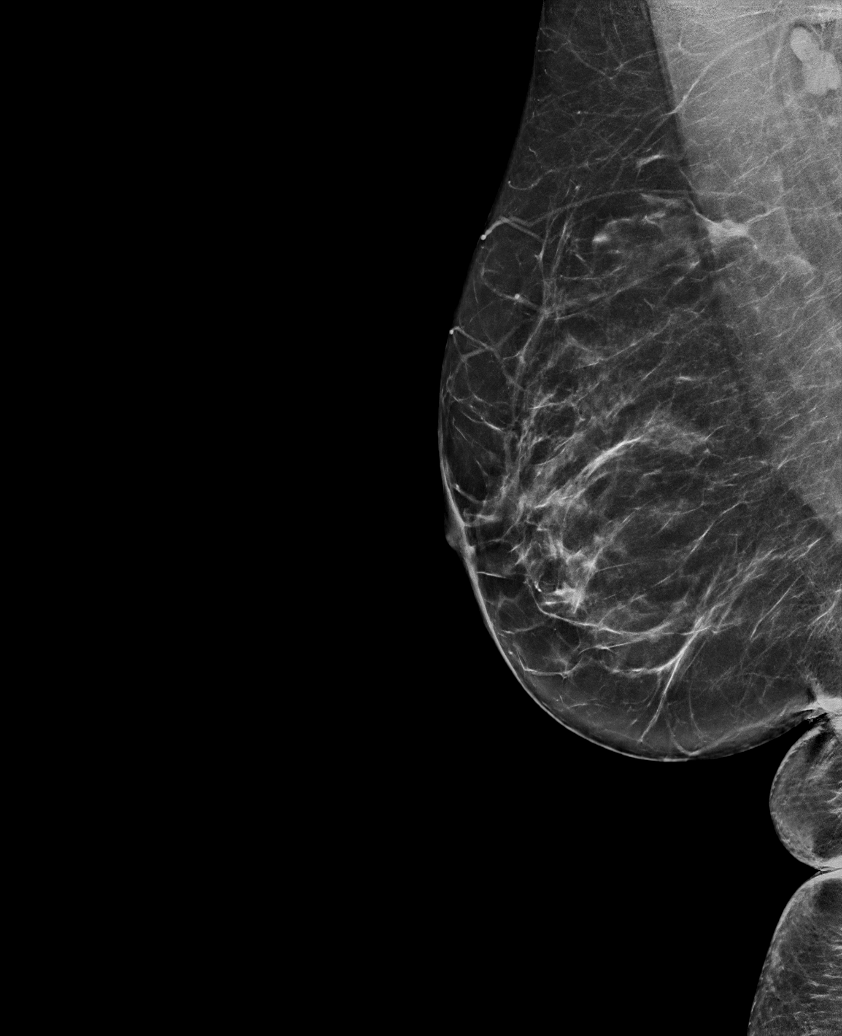

[L CC synth-2D]
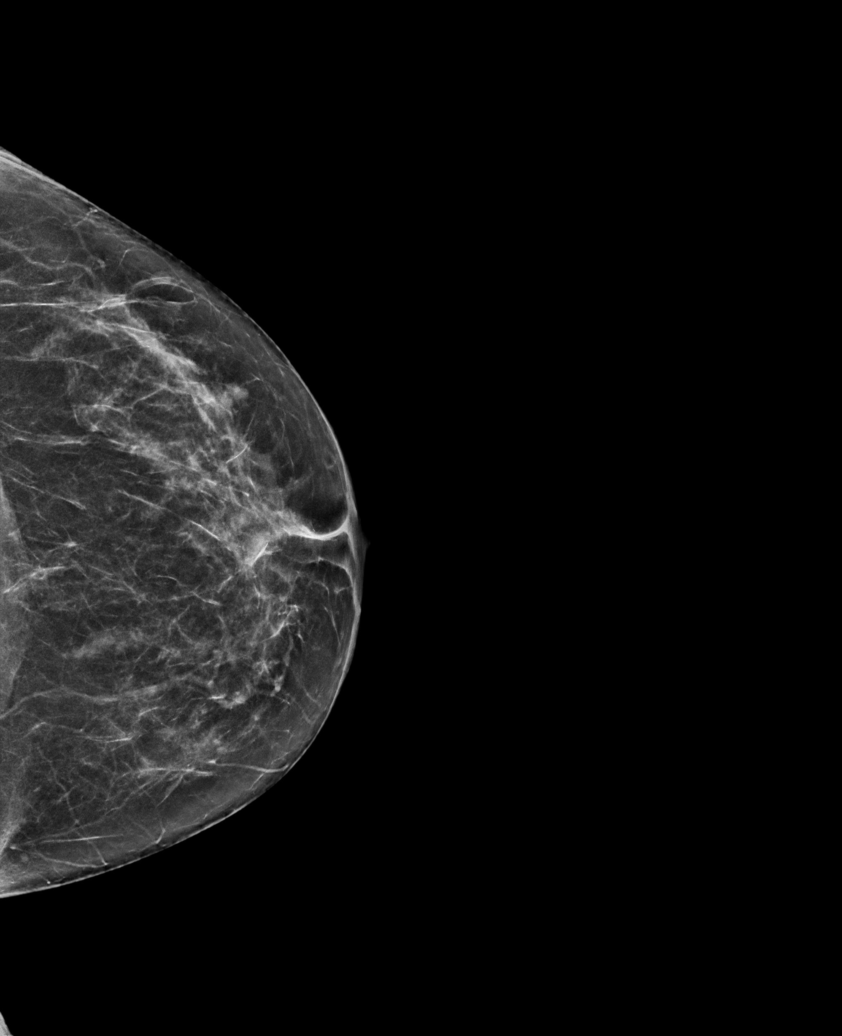

[R MLO synth-2D (2 of 2)]
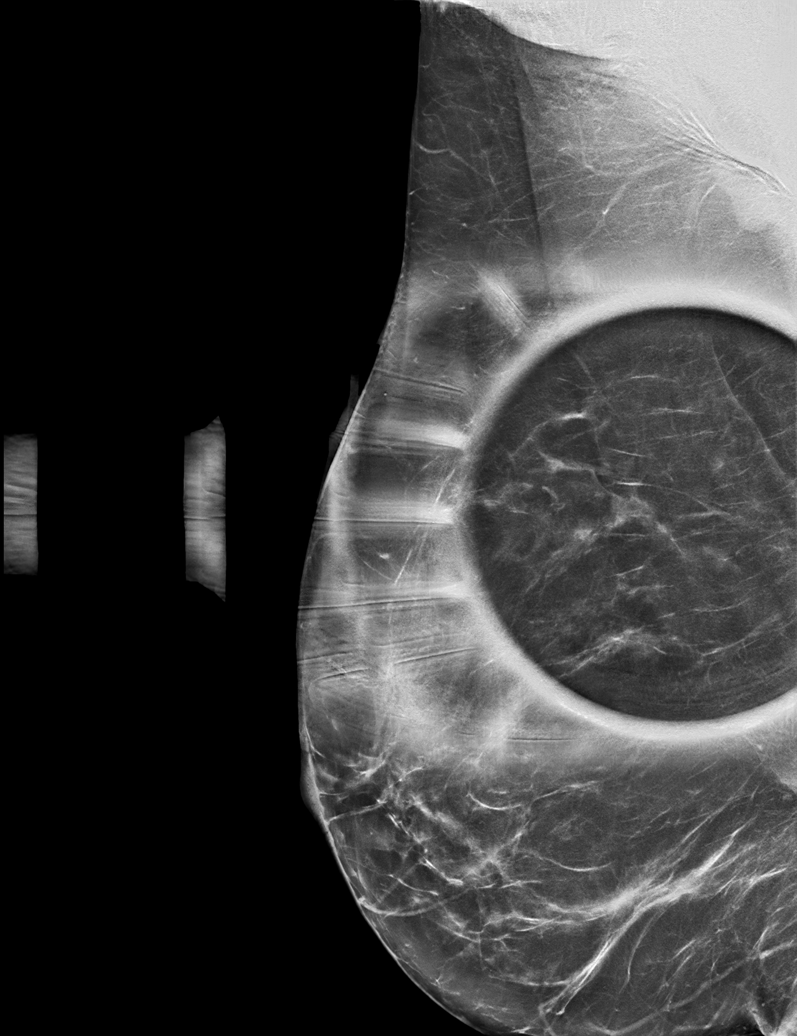

[L MLO synth-2D]
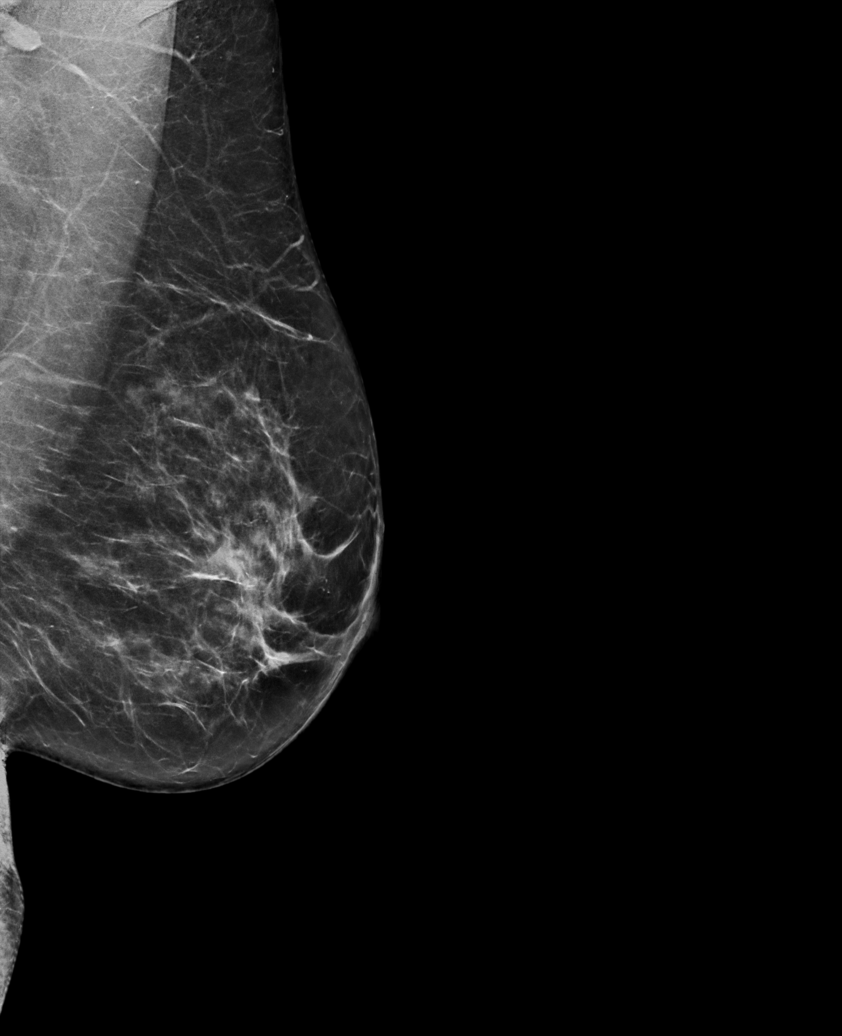

[R CC synth-2D]
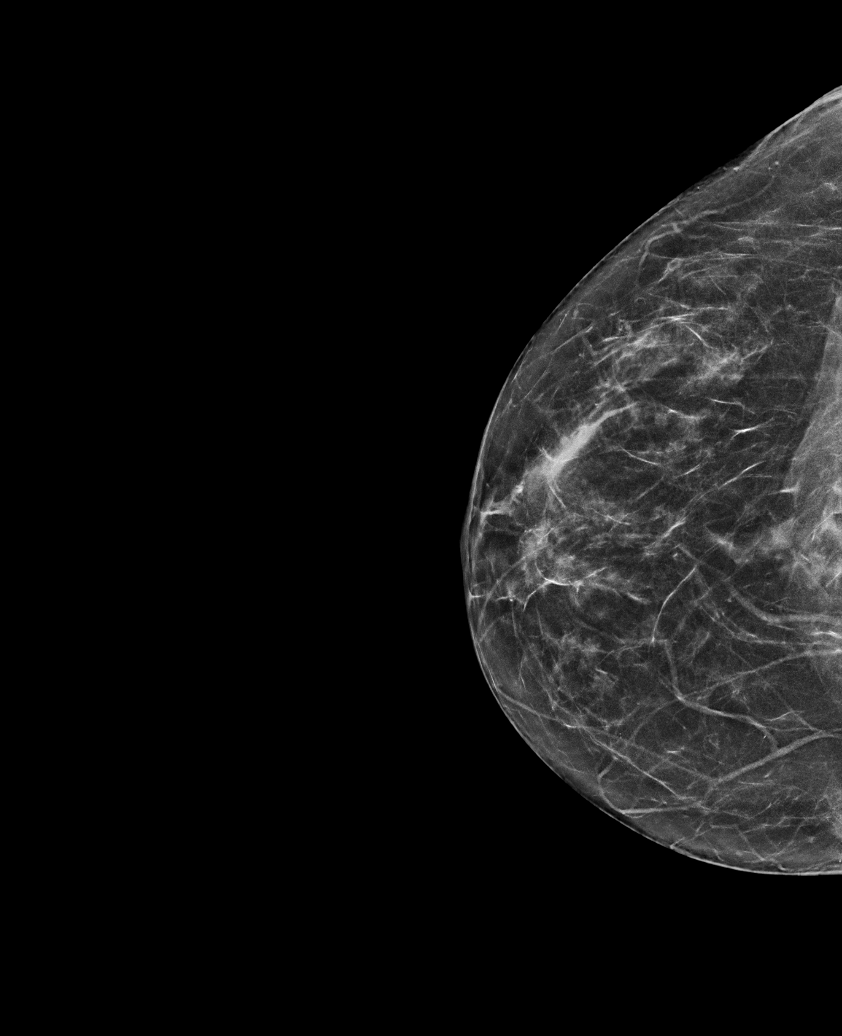

[6 of 36 positions shown; findings below may reference images not displayed]

ACR Breast Density Category c: The breast tissue is heterogeneously
dense, which may obscure small masses.
FINDINGS: The previously described focal asymmetry in the upper, slightly
inner right breast at far posterior depth is unchanged dating back
to mammogram 08/06/2016. No new suspicious masses, calcifications or
other findings in either breast.
IMPRESSION: Over 4 years of stability of focal asymmetry in the slightly inner,
upper right breast at posterior depth, suggestive of benignity and
likely representing asymmetric fibroglandular tissue. No additional
follow-up is indicated.

RECOMMENDATION:
Annual screening mammogram.

I have discussed the findings and recommendations with the patient.
If applicable, a reminder letter will be sent to the patient
regarding the next appointment.

BI-RADS CATEGORY  2: Benign.

## 2022-04-01 ENCOUNTER — Other Ambulatory Visit: Payer: Self-pay | Admitting: Family

## 2022-04-01 DIAGNOSIS — I10 Essential (primary) hypertension: Secondary | ICD-10-CM

## 2022-06-09 ENCOUNTER — Other Ambulatory Visit: Payer: Self-pay | Admitting: Family

## 2022-06-09 DIAGNOSIS — Z87898 Personal history of other specified conditions: Secondary | ICD-10-CM

## 2022-06-29 ENCOUNTER — Other Ambulatory Visit: Payer: Self-pay | Admitting: Family

## 2022-06-29 DIAGNOSIS — F4010 Social phobia, unspecified: Secondary | ICD-10-CM

## 2022-06-29 DIAGNOSIS — F5101 Primary insomnia: Secondary | ICD-10-CM

## 2022-07-02 NOTE — Telephone Encounter (Signed)
Spoke to pt and scheduled appt for 07/09/22

## 2022-07-05 ENCOUNTER — Other Ambulatory Visit: Payer: Self-pay | Admitting: Family

## 2022-07-05 DIAGNOSIS — G43009 Migraine without aura, not intractable, without status migrainosus: Secondary | ICD-10-CM

## 2022-07-05 DIAGNOSIS — I10 Essential (primary) hypertension: Secondary | ICD-10-CM

## 2022-07-09 ENCOUNTER — Encounter: Payer: Self-pay | Admitting: Family

## 2022-07-09 ENCOUNTER — Ambulatory Visit: Payer: 59 | Admitting: Family

## 2022-07-09 VITALS — BP 124/80 | HR 71 | Temp 97.6°F | Ht 63.0 in | Wt 146.4 lb

## 2022-07-09 DIAGNOSIS — R7989 Other specified abnormal findings of blood chemistry: Secondary | ICD-10-CM | POA: Diagnosis not present

## 2022-07-09 DIAGNOSIS — G43009 Migraine without aura, not intractable, without status migrainosus: Secondary | ICD-10-CM | POA: Diagnosis not present

## 2022-07-09 DIAGNOSIS — E785 Hyperlipidemia, unspecified: Secondary | ICD-10-CM

## 2022-07-09 DIAGNOSIS — E663 Overweight: Secondary | ICD-10-CM

## 2022-07-09 DIAGNOSIS — I10 Essential (primary) hypertension: Secondary | ICD-10-CM

## 2022-07-09 DIAGNOSIS — Z6825 Body mass index (BMI) 25.0-25.9, adult: Secondary | ICD-10-CM

## 2022-07-09 DIAGNOSIS — J309 Allergic rhinitis, unspecified: Secondary | ICD-10-CM

## 2022-07-09 MED ORDER — METOPROLOL SUCCINATE ER 100 MG PO TB24: ORAL_TABLET | ORAL | 3 refills | Status: DC

## 2022-07-09 MED ORDER — RIZATRIPTAN BENZOATE 10 MG PO TABS
ORAL_TABLET | ORAL | 3 refills | Status: DC
Start: 2022-07-09 — End: 2023-03-15

## 2022-07-09 MED ORDER — IPRATROPIUM BROMIDE 0.06 % NA SOLN
NASAL | 11 refills | Status: AC
Start: 2022-07-09 — End: ?

## 2022-07-09 MED ORDER — ROSUVASTATIN CALCIUM 20 MG PO TABS
20.0000 mg | ORAL_TABLET | Freq: Every day | ORAL | 3 refills | Status: DC
Start: 2022-07-09 — End: 2023-08-20

## 2022-07-09 MED ORDER — HYDROCHLOROTHIAZIDE 12.5 MG PO TABS
ORAL_TABLET | ORAL | 3 refills | Status: DC
Start: 2022-07-09 — End: 2023-07-02

## 2022-07-09 MED ORDER — PHENTERMINE HCL 37.5 MG PO TABS
ORAL_TABLET | ORAL | 2 refills | Status: DC
Start: 2022-07-09 — End: 2023-05-07

## 2022-07-09 NOTE — Progress Notes (Signed)
Assessment & Plan:  Abnormal bilirubin test -     Comprehensive metabolic panel; Future -     Gamma GT; Future -     Bilirubin, fractionated(tot/dir/indir); Future  Essential hypertension -     hydroCHLOROthiazide; TAKE 1 TABLET BY MOUTH ONCE DAILY (NEEDS  APPPOINTMENT)  Dispense: 90 tablet; Refill: 3 -     Metoprolol Succinate ER; TAKE 1 TABLET BY MOUTH ONCE DAILY (TAKE  IMMEDIATELY  FOLLOWING  A  MEAL.  NEEDS  APPOINTMENT)  Dispense: 90 tablet; Refill: 3  Allergic rhinitis, unspecified seasonality, unspecified trigger -     Ipratropium Bromide; USE 2 SPRAY(S) IN EACH NOSTRIL 4 TIMES DAILY  Dispense: 15 mL; Refill: 11  Migraine without aura and without status migrainosus, not intractable Assessment & Plan: Chronic, stable.  Menstrual related. Headaches similar to previous headache.  Advised trial of magnesium citrate 400 mg daily.  Continue Maxalt 10mg  prn for rescue.  Orders: -     Metoprolol Succinate ER; TAKE 1 TABLET BY MOUTH ONCE DAILY (TAKE  IMMEDIATELY  FOLLOWING  A  MEAL.  NEEDS  APPOINTMENT)  Dispense: 90 tablet; Refill: 3 -     Rizatriptan Benzoate; TAKE 1 TABLET BY MOUTH AS NEEDED FOR MIGRAINE - MAY REPEAT IN 2 HOURS IF NEEDED  Dispense: 10 tablet; Refill: 3  Hyperlipidemia, unspecified hyperlipidemia type -     Rosuvastatin Calcium; Take 1 tablet (20 mg total) by mouth daily.  Dispense: 90 tablet; Refill: 3 -     Lipid panel; Future  Overweight (BMI 25.0-29.9) Assessment & Plan: Patient 10 pounds away from her goal weight of 135 pounds.  We discussed the risk of phentermine as a relates to cardiovascular events,  elevation in blood pressure, heart rate.  She is very much aware of these risks.  She has been on phentermine in the past.  We agreed a 65-month supply without refill is appropriate to help her achieve her goal.  She will continue to create a caloric deficit to maintain weight loss.  Orders: -     Phentermine HCl; Take half tablet daily  PO prior to  breakfast.After a week, may increase to full tablet in needed  Dispense: 30 tablet; Refill: 2  Primary hypertension Assessment & Plan: Chronic, stable.  Continue hctz 12.5mg  qd, toprol 100mg  qd      Return precautions given.   Risks, benefits, and alternatives of the medications and treatment plan prescribed today were discussed, and patient expressed understanding.   Education regarding symptom management and diagnosis given to patient on AVS either electronically or printed.  Return in about 3 months (around 10/09/2022) for Fasting labs in 2-3 weeks.  Rennie Plowman, FNP  Subjective:    Patient ID: Heather [name], female    DOB: 10/03/1975, 47 y.o.   MRN: [id number]  CC: Heather [name] is a 47 y.o. female who presents today for follow up.   HPI: She in interested in phentermine.  She has lost weight and "feeling so much better".  She is working hard to create a daily caloric deficit, eating healthy. She has taken phentermine in the past with excessive losing weight, appetite suppression.  She did not have palpitations or chest pain on medication.  She did have dry mouth however it was optically bothersome.   Migraine- she is using maxalt once monthly either for headache which occurs prior menses or just after.  Otherwise she does not have headaches.  Headaches feel similar to previous headache  Allergies: Gluten meal  Current Outpatient Medications on File Prior to Visit  Medication Sig Dispense Refill   Diclofenac Sodium 2 % SOLN Place onto the skin.     No current facility-administered medications on file prior to visit.    Review of Systems  Constitutional:  Negative for chills and fever.  Respiratory:  Negative for cough.   Cardiovascular:  Negative for chest pain and palpitations.  Gastrointestinal:  Negative for nausea and vomiting.      Objective:    BP 124/80   Pulse 71   Temp 97.6 F (36.4 C) (Oral)   Ht 5\' 3"  (1.6 m)   Wt 146 lb 6.4 oz (66.4 kg)    SpO2 99%   BMI 25.93 kg/m  BP Readings from Last 3 Encounters:  07/09/22 124/80  09/01/21 126/76  07/10/21 116/68   Wt Readings from Last 3 Encounters:  07/09/22 146 lb 6.4 oz (66.4 kg)  09/01/21 153 lb (69.4 kg)  07/10/21 149 lb (67.6 kg)    Physical Exam Vitals reviewed.  Constitutional:      Appearance: She is well-developed.  Eyes:     Conjunctiva/sclera: Conjunctivae normal.  Cardiovascular:     Rate and Rhythm: Normal rate and regular rhythm.     Pulses: Normal pulses.     Heart sounds: Normal heart sounds.  Pulmonary:     Effort: Pulmonary effort is normal.     Breath sounds: Normal breath sounds. No wheezing, rhonchi or rales.  Skin:    General: Skin is warm and dry.  Neurological:     Mental Status: She is alert.  Psychiatric:        Speech: Speech normal.        Behavior: Behavior normal.        Thought Content: Thought content normal.

## 2022-07-09 NOTE — Assessment & Plan Note (Signed)
Chronic, stable.  Continue hctz 12.5mg  qd, toprol 100mg  qd

## 2022-07-09 NOTE — Assessment & Plan Note (Signed)
Patient 10 pounds away from her goal weight of 135 pounds.  We discussed the risk of phentermine as a relates to cardiovascular events,  elevation in blood pressure, heart rate.  She is very much aware of these risks.  She has been on phentermine in the past.  We agreed a 31-month supply without refill is appropriate to help her achieve her goal.  She will continue to create a caloric deficit to maintain weight loss.

## 2022-07-09 NOTE — Assessment & Plan Note (Signed)
Chronic, stable.  Menstrual related. Headaches similar to previous headache.  Advised trial of magnesium citrate 400 mg daily.  Continue Maxalt 10mg  prn for rescue.

## 2022-07-09 NOTE — Patient Instructions (Addendum)
Start magnesium citrate 400mg  daily. If you have menstrual migraine, you may start magnesium beginning 15 days prior to menses until the start of menses. However it might be easier just to take it as a daily preventative.    Often as a daily preventative, I recommend taking magnesium citrate with riboflavin 400mg  daily and CoQ10 100mg  three times daily. You may find these supplements over the counter.   Please stay vigilant in regards to side effects in particular escalations in blood pressure, palpitations, increased anxiety.    We will aim to lose  1 and no more than 2 pounds per week.    If your weight were to plateau as at that point I advised that we would stop phentermine.  Start by taking half tablet or 18.75 mg ( full tablet is 37.5mg )   and then progress if needed after several days to the full tablet .   Phentermine Capsules or Tablets What is this medication? PHENTERMINE (FEN ter meen) promotes weight loss. It works by decreasing appetite. It is often used for a short period of time. Changes to diet and exercise are often combined with this medication. This medicine may be used for other purposes; ask your health care provider or pharmacist if you have questions. COMMON BRAND NAME(S): Adipex-P, Atti-Plex P, Atti-Plex P Spansule, Fastin, Lomaira, Pro-Fast, Pro-Fast HS, Pro-Fast SA, Tara-8 What should I tell my care team before I take this medication? They need to know if you have any of these conditions: Agitation or nervousness Diabetes Glaucoma Heart disease High blood pressure History of substance use disorder History of stroke Kidney disease Lung disease called Primary Pulmonary Hypertension (PPH) Taken an MAOI, such as Carbex, Eldepryl, Marplan, Nardil, or Parnate in last 14 days Taking stimulant medications for attention disorders, weight loss, or to stay awake Thyroid disease An unusual or allergic reaction to phentermine, other medications, foods, dyes, or  preservatives Pregnant or trying to get pregnant Breastfeeding How should I use this medication? Take this medication by mouth with a glass of water. Follow the directions on the prescription label. Take your medication at regular intervals. Do not take it more often than directed. Do not stop taking except on your care team's advice. Talk to your care team about the use of this medication in children. While this medication may be prescribed for children 17 years or older for selected conditions, precautions do apply. Overdosage: If you think you have taken too much of this medicine contact a poison control center or emergency room at once. NOTE: This medicine is only for you. Do not share this medicine with others. What if I miss a dose? If you miss a dose, take it as soon as you can. If it is almost time for your next dose, take only that dose. Do not take double or extra doses. What may interact with this medication? Do not take this medication with any of the following: MAOIs, such as Carbex, Eldepryl, Marplan, Nardil, and Parnate This medication may also interact with the following: Alcohol Certain medications for depression, anxiety, or other mental health conditions Certain medications for blood pressure Linezolid Medications for colds or breathing difficulties, such as pseudoephedrine or phenylephrine Medications for diabetes Sibutramine Stimulant medications for ADHD, weight loss, or staying awake This list may not describe all possible interactions. Give your health care provider a list of all the medicines, herbs, non-prescription drugs, or dietary supplements you use. Also tell them if you smoke, drink alcohol, or use illegal drugs. Some  items may interact with your medicine. What should I watch for while using this medication? Visit your care team for regular checks on your progress. Do not stop taking except on your care team's advice. You may develop a severe reaction. Your  care team will tell you how much medication to take. Do not take this medication close to bedtime. It may prevent you from sleeping. This medication may affect your coordination, reaction time, or judgment. Do not drive or operate machinery until you know how this medication affects you. Sit up or stand slowly to reduce the risk of dizzy or fainting spells. Drinking alcohol with this medication can increase the risk of these side effects. This medication may affect blood sugar levels. Ask your care team if changes in diet or medications are needed if you have diabetes. Inform your care team if you wish to become pregnant or think you might be pregnant. Losing weight while pregnant is not advised and may cause harm to the unborn child. Talk to your care team for more information. What side effects may I notice from receiving this medication? Side effects that you should report to your care team as soon as possible: Allergic reactions--skin rash, itching, hives, swelling of the face, lips, tongue, or throat Heart valve disease--shortness of breath, chest pain, unusual weakness or fatigue, dizziness, feeling faint or lightheaded, fever, sudden weight gain, fast or irregular heartbeat Pulmonary hypertension--shortness of breath, chest pain, fast or irregular heartbeat, feeling faint or lightheaded, fatigue, swelling of the ankles or feet Side effects that usually do not require medical attention (report to your care team if they continue or are bothersome): Change in taste Diarrhea Dizziness Dry mouth Restlessness Trouble sleeping This list may not describe all possible side effects. Call your doctor for medical advice about side effects. You may report side effects to FDA at 1-800-FDA-1088. Where should I keep my medication? Keep out of the reach of children. This medication can be abused. Keep your medication in a safe place to protect it from theft. Do not share this medication with anyone. Selling  or giving away this medication is dangerous and against the law. This medication may cause harm and death if it is taken by other adults, children, or pets. Return medication that has not been used to an official disposal site. Contact the DEA at 515-173-1805 or your city/county government to find a site. If you cannot return the medication, mix any unused medication with a substance like cat litter or coffee grounds. Then throw the medication away in a sealed container like a sealed bag or coffee can with a lid. Do not use the medication after the expiration date. Store at room temperature between 20 and 25 degrees C (68 and 77 degrees F). Keep container tightly closed. NOTE: This sheet is a summary. It may not cover all possible information. If you have questions about this medicine, talk to your doctor, pharmacist, or health care provider.  2023 Elsevier/Gold Standard (2007-03-29 00:00:00)

## 2022-07-19 ENCOUNTER — Telehealth: Payer: Self-pay

## 2022-07-19 NOTE — Telephone Encounter (Signed)
PA initiated via Covermymeds; KEY: [id number]. Awaiting determination.

## 2022-07-19 NOTE — Telephone Encounter (Signed)
PA denied.   The request for coverage for PHENTERMINE TAB 37.5MG , use as directed (30 per month), is denied. This decision is based on health plan criteria for PHENTERMINE TAB 37.5MG . This medicine is covered only if: You have a body mass index greater than or equal to 27kg/m2. The information provided does not show that you meet the criteria listed above. Reviewed by: [email or web address], R.Ph.

## 2022-07-20 NOTE — Telephone Encounter (Signed)
Spoke with pt to let her know that the phentermine was denied. Pt gave a verbal understanding and stated that she does not need an appt at this time.

## 2023-03-14 ENCOUNTER — Other Ambulatory Visit: Payer: Self-pay | Admitting: Family

## 2023-03-14 DIAGNOSIS — G43009 Migraine without aura, not intractable, without status migrainosus: Secondary | ICD-10-CM

## 2023-05-07 ENCOUNTER — Ambulatory Visit: Admitting: Family

## 2023-05-07 ENCOUNTER — Encounter: Payer: Self-pay | Admitting: Family

## 2023-05-07 VITALS — BP 124/82 | HR 76 | Temp 97.7°F | Ht 63.0 in | Wt 150.4 lb

## 2023-05-07 DIAGNOSIS — N921 Excessive and frequent menstruation with irregular cycle: Secondary | ICD-10-CM

## 2023-05-07 DIAGNOSIS — N920 Excessive and frequent menstruation with regular cycle: Secondary | ICD-10-CM | POA: Insufficient documentation

## 2023-05-07 LAB — IBC + FERRITIN
Ferritin: 16.3 ng/mL (ref 10.0–291.0)
Iron: 81 ug/dL (ref 42–145)
Saturation Ratios: 21.3 % (ref 20.0–50.0)
TIBC: 380.8 ug/dL (ref 250.0–450.0)
Transferrin: 272 mg/dL (ref 212.0–360.0)

## 2023-05-07 LAB — CBC WITH DIFFERENTIAL/PLATELET
Basophils Absolute: 0 10*3/uL (ref 0.0–0.1)
Basophils Relative: 0.4 % (ref 0.0–3.0)
Eosinophils Absolute: 0.1 10*3/uL (ref 0.0–0.7)
Eosinophils Relative: 0.8 % (ref 0.0–5.0)
HCT: 34.5 % — ABNORMAL LOW (ref 36.0–46.0)
Hemoglobin: 12.4 g/dL (ref 12.0–15.0)
Lymphocytes Relative: 31.5 % (ref 12.0–46.0)
Lymphs Abs: 2.3 10*3/uL (ref 0.7–4.0)
MCHC: 35.8 g/dL (ref 30.0–36.0)
MCV: 88.5 fl (ref 78.0–100.0)
Monocytes Absolute: 0.4 10*3/uL (ref 0.1–1.0)
Monocytes Relative: 5.4 % (ref 3.0–12.0)
Neutro Abs: 4.5 10*3/uL (ref 1.4–7.7)
Neutrophils Relative %: 61.9 % (ref 43.0–77.0)
Platelets: 251 10*3/uL (ref 150.0–400.0)
RBC: 3.9 Mil/uL (ref 3.87–5.11)
RDW: 12.8 % (ref 11.5–15.5)
WBC: 7.2 10*3/uL (ref 4.0–10.5)

## 2023-05-07 LAB — TSH: TSH: 2.81 u[IU]/mL (ref 0.35–5.50)

## 2023-05-07 LAB — FOLLICLE STIMULATING HORMONE: FSH: 4 m[IU]/mL

## 2023-05-07 NOTE — Progress Notes (Unsigned)
   Assessment & Plan:  There are no diagnoses linked to this encounter.   Return precautions given.   Risks, benefits, and alternatives of the medications and treatment plan prescribed today were discussed, and patient expressed understanding.   Education regarding symptom management and diagnosis given to patient on AVS either electronically or printed.  No follow-ups on file.  Rennie Plowman, FNP  Subjective:    Patient ID: Heather [name], female    DOB: [birth year], 48 y.o.   MRN: [id number]  CC: Heather [name] is a 48 y.o. female who presents today for acute visit  HPI: Complains of menorrhagia x 3 days, lighter today.  4 days prior a 'regular menses' started.   She started to change tampon and pad hourly. Describes clots larger than 'quarter'.   She has noticed that she may skip a menses and longer duration inbetween .   LMP 04/06/23 which was very light.   Menses was two weeks late.    No fever, concern for STDs, hematuria, abdominal pain, rectal plan.   Sexually active ; denies dysparenia.     No h/o bleeding disorder No h/o fibroids.    Due pap  Last pap smear 06/19/2016.  Negative malignancy, negative HPV  No   Previously followed with Dr Macon Large.      Allergies: Gluten meal Current Outpatient Medications on File Prior to Visit  Medication Sig Dispense Refill   Diclofenac Sodium 2 % SOLN Place onto the skin.     hydrochlorothiazide (HYDRODIURIL) 12.5 MG tablet TAKE 1 TABLET BY MOUTH ONCE DAILY (NEEDS  APPPOINTMENT) 90 tablet 3   ipratropium (ATROVENT) 0.06 % nasal spray USE 2 SPRAY(S) IN EACH NOSTRIL 4 TIMES DAILY 15 mL 11   metoprolol succinate (TOPROL-XL) 100 MG 24 hr tablet TAKE 1 TABLET BY MOUTH ONCE DAILY (TAKE  IMMEDIATELY  FOLLOWING  A  MEAL.  NEEDS  APPOINTMENT) 90 tablet 3   phentermine (ADIPEX-P) 37.5 MG tablet Take half tablet daily  PO prior to breakfast.After a week, may increase to full tablet in needed 30 tablet 2   rizatriptan  (MAXALT) 10 MG tablet TAKE ONE TABLET BY MOUTH AS NEEDED FOR MIGRAINE. MAY REPEAT IN TWO HOURS IF NEEDED 10 tablet 1   rosuvastatin (CRESTOR) 20 MG tablet Take 1 tablet (20 mg total) by mouth daily. 90 tablet 3   No current facility-administered medications on file prior to visit.    Review of Systems    Objective:    BP 124/82   Pulse 76   Temp 97.7 F (36.5 C) (Oral)   Ht 5\' 3"  (1.6 m)   Wt 150 lb 6.4 oz (68.2 kg)   LMP 05/03/2023 (Exact Date)   SpO2 97%   BMI 26.64 kg/m  BP Readings from Last 3 Encounters:  05/07/23 124/82  07/09/22 124/80  09/01/21 126/76   Wt Readings from Last 3 Encounters:  05/07/23 150 lb 6.4 oz (68.2 kg)  07/09/22 146 lb 6.4 oz (66.4 kg)  09/01/21 153 lb (69.4 kg)    Physical Exam

## 2023-05-07 NOTE — Assessment & Plan Note (Signed)
 Hemodynamically stable.  Pending labs at this time, transvaginal ultrasound.

## 2023-05-08 ENCOUNTER — Encounter: Payer: Self-pay | Admitting: Family

## 2023-05-08 LAB — HCG, SERUM, QUALITATIVE: Preg, Serum: NEGATIVE

## 2023-05-08 LAB — PROLACTIN: Prolactin: 15 ng/mL

## 2023-05-09 ENCOUNTER — Other Ambulatory Visit: Payer: Self-pay | Admitting: Family

## 2023-05-09 ENCOUNTER — Ambulatory Visit
Admission: RE | Admit: 2023-05-09 | Discharge: 2023-05-09 | Disposition: A | Discharge status: Home / Self Care | Source: Ambulatory Visit | Attending: Family | Admitting: Family

## 2023-05-09 DIAGNOSIS — N921 Excessive and frequent menstruation with irregular cycle: Secondary | ICD-10-CM

## 2023-05-09 MED ORDER — LEVONORGESTREL-ETHINYL ESTRAD 0.1-20 MG-MCG PO TABS: 1.0000 | ORAL_TABLET | Freq: Every day | ORAL | 2 refills | Status: AC

## 2023-05-10 ENCOUNTER — Encounter: Payer: Self-pay | Admitting: Family

## 2023-05-10 ENCOUNTER — Other Ambulatory Visit: Payer: Self-pay | Admitting: Family

## 2023-05-10 DIAGNOSIS — N921 Excessive and frequent menstruation with irregular cycle: Secondary | ICD-10-CM

## 2023-06-30 ENCOUNTER — Other Ambulatory Visit: Payer: Self-pay | Admitting: Family

## 2023-06-30 DIAGNOSIS — I10 Essential (primary) hypertension: Secondary | ICD-10-CM

## 2023-07-09 ENCOUNTER — Ambulatory Visit: Admitting: Obstetrics & Gynecology

## 2023-07-09 ENCOUNTER — Other Ambulatory Visit (HOSPITAL_COMMUNITY)
Admission: RE | Admit: 2023-07-09 | Discharge: 2023-07-09 | Disposition: A | Discharge status: Home / Self Care | Source: Ambulatory Visit | Attending: Obstetrics & Gynecology | Admitting: Obstetrics & Gynecology

## 2023-07-09 ENCOUNTER — Encounter: Payer: Self-pay | Admitting: Obstetrics & Gynecology

## 2023-07-09 VITALS — BP 134/83 | HR 74 | Wt 157.0 lb

## 2023-07-09 DIAGNOSIS — Z1211 Encounter for screening for malignant neoplasm of colon: Secondary | ICD-10-CM | POA: Diagnosis not present

## 2023-07-09 DIAGNOSIS — Z1231 Encounter for screening mammogram for malignant neoplasm of breast: Secondary | ICD-10-CM

## 2023-07-09 DIAGNOSIS — Z01419 Encounter for gynecological examination (general) (routine) without abnormal findings: Secondary | ICD-10-CM

## 2023-07-09 NOTE — Progress Notes (Signed)
 GYNECOLOGY ANNUAL PREVENTATIVE CARE ENCOUNTER NOTE  History:    Heather [name] is a 48 y.o. G12P1001 female here for a routine annual gynecologic exam.  Current complaints: had one episode of heavy menstrual period in March 2025.  Evaluated by PCP, had normal hemoglobin and TSH, ultrasound showed 7 mm echogenic endometrial focus, possible polyp.  She was started on Alesse (90 days). No heavy bleeding since then, but reports breast tenderness, headaches, weight gain and wants to stop the OCPs.   Denies abnormal vaginal bleeding, discharge, pelvic pain, problems with intercourse or other gynecologic concerns.  Gynecologic History Patient's last menstrual period was 07/03/2023. Contraception: none Last Pap: 06/20/2026. Result was normal with negative HPV Last Mammogram: 2022.  Result was normal Last Colonoscopy: Never had one.   Obstetric History OB History  Gravida Para Term Preterm AB Living  1 1 1   1   SAB IAB Ectopic Multiple Live Births      1    # Outcome Date GA Lbr Len/2nd Weight Sex Type Anes PTL Lv  1 Term 03/10/93     Vag-Spont   LIV    Past Medical History:  Diagnosis Date   Hyperlipidemia    Hypertension    Nail fungus 06.14.2014    Past Surgical History:  Procedure Laterality Date   VAGINAL DELIVERY      Current Outpatient Medications on File Prior to Visit  Medication Sig Dispense Refill   Diclofenac Sodium 2 % SOLN Place onto the skin.     hydrochlorothiazide  (HYDRODIURIL ) 12.5 MG tablet TAKE 1 TABLET BY MOUTH ONCE DAILY (NEEDS  APPOINTMENT) 90 tablet 0   ipratropium (ATROVENT ) 0.06 % nasal spray USE 2 SPRAY(S) IN EACH NOSTRIL 4 TIMES DAILY 15 mL 11   levonorgestrel -ethinyl estradiol (ALESSE) 0.1-20 MG-MCG tablet Take 1 tablet by mouth daily. 90 tablet 2   metoprolol  succinate (TOPROL -XL) 100 MG 24 hr tablet TAKE 1 TABLET BY MOUTH ONCE DAILY (TAKE  IMMEDIATELY  FOLLOWING  A  MEAL.  NEEDS  APPOINTMENT) 90 tablet 3   rizatriptan  (MAXALT ) 10 MG tablet TAKE  ONE TABLET BY MOUTH AS NEEDED FOR MIGRAINE. MAY REPEAT IN TWO HOURS IF NEEDED 10 tablet 1   rosuvastatin  (CRESTOR ) 20 MG tablet Take 1 tablet (20 mg total) by mouth daily. 90 tablet 3   No current facility-administered medications on file prior to visit.    Allergies  Allergen Reactions   Gluten Meal Diarrhea    Social History:  reports that she has never smoked. She has never used smokeless tobacco. She reports that she does not drink alcohol and does not use drugs.  Family History  Problem Relation Age of Onset   Hypertension Mother    Insomnia Mother    Heart disease Father    Heart disease Maternal Grandmother    Heart disease Maternal Grandfather    CVA Paternal Grandmother    Breast cancer Neg Hx    Allergic rhinitis Neg Hx    Eczema Neg Hx    Asthma Neg Hx    Urticaria Neg Hx    Thyroid  cancer Neg Hx     The following portions of the patient's history were reviewed and updated as appropriate: allergies, current medications, past family history, past medical history, past social history, past surgical history and problem list.  Review of Systems Pertinent items noted in HPI and remainder of comprehensive ROS otherwise negative.  Physical Exam:  BP 134/83   Pulse 74   Wt 157 lb (  71.2 kg)   LMP 07/03/2023   BMI 27.81 kg/m  CONSTITUTIONAL: Well-developed, well-nourished female in no acute distress.  HENT:  Normocephalic, atraumatic, External right and left ear normal.  EYES: Conjunctivae and EOM are normal. Pupils are equal, round, and reactive to light. No scleral icterus.  NECK: Normal range of motion, supple, no masses observed. SKIN: Skin is warm and dry. No rash noted. Not diaphoretic. No erythema. No pallor. MUSCULOSKELETAL: Normal range of motion. No tenderness.  No cyanosis, clubbing, or edema. NEUROLOGIC: Alert and oriented to person, place, and time. Normal muscle tone coordination.  PSYCHIATRIC: Normal mood and affect. Normal behavior. Normal judgment and  thought content. CARDIOVASCULAR: Normal heart rate noted, regular rhythm RESPIRATORY: Clear to auscultation bilaterally. Effort and breath sounds normal, no problems with respiration noted. BREASTS: Symmetric in size. No masses, tenderness, skin changes, nipple drainage, or lymphadenopathy bilaterally. Performed in the presence of a chaperone. ABDOMEN: Soft, no distention noted.  No tenderness, rebound or guarding.  PELVIC: Normal appearing external genitalia and urethral meatus; normal appearing vaginal mucosa and cervix.  No abnormal vaginal discharge noted.  Pap smear obtained.  Normal uterine size, no other palpable masses, no uterine or adnexal tenderness.  Performed in the presence of a chaperone.  Imaging: US  PELVIC COMPLETE WITH TRANSVAGINAL Result Date: 05/09/2023 CLINICAL DATA:  Heavy bleeding.  Blood clots EXAM: TRANSABDOMINAL AND TRANSVAGINAL ULTRASOUND OF PELVIS TECHNIQUE: Both transabdominal and transvaginal ultrasound examinations of the pelvis were performed. Transabdominal technique was performed for global imaging of the pelvis including uterus, ovaries, adnexal regions, and pelvic cul-de-sac. It was necessary to proceed with endovaginal exam following the transabdominal exam to visualize the endometrium and adnexa. COMPARISON:  None Available. FINDINGS: Uterus Measurements: 9.4 x 4.9 x 6.4 cm = volume: 154 mL. Heterogeneous myometrium. No discrete mass lesion in the myometrium. Nabothian cysts are seen. Endometrium Thickness: 11 mm. Eccentric echogenic area noted along the endometrium to the left measuring 7 x 4 x 7 mm. Small lesion is possible. No clear abnormal blood flow on color Doppler in this location. Right ovary Measurements: 2.3 x 1.6 x 1.9 cm = volume: 4.0 mL. Normal appearance/no adnexal mass. Left ovary Measurements: 4.2 x 2.8 x 3.2 cm = volume: 20 mL. There is some small cystic foci in the left ovary measuring up to 2.2 cm. This is likely 2 adjacent similar foci rather than a  larger septated focus. Other findings No abnormal free fluid. Limited transabdominal images due to overlapping bowel gas, soft tissue and underdistended urinary bladder. IMPRESSION: Heterogeneous myometrium without focal mass. 7 mm echogenic rounded focus in the left side of the endometrium without blood flow. Underlying lesion is possible such as a small polyp. Please correlate for specific history and prior imaging. Further workup otherwise may be useful. No significant free fluid in the pelvis. Benign-appearing left-sided ovarian cystic foci. Electronically Signed   By: Adrianna Horde M.D.   On: 05/09/2023 17:07    Assessment and Plan:     1. Breast cancer screening by mammogram Mammogram scheduled for breast cancer screening. - MM 3D SCREENING MAMMOGRAM BILATERAL BREAST; Future  2. Screening for colon cancer Discussed need for colon cancer screening over the age 43. Colonoscopy recommended as this is the gold standard.  Patient declined colonoscopy, so discussed Cologuard.   Emphasized that positive Cologuard tests will need to be follow up with diagnostic colonoscopy. Cologuard ordered. - Cologuard  3. Well woman exam with routine gynecological exam (Primary) - Cytology - PAP Will follow up  results of pap smear and manage accordingly. Patient will stop pills for now.  If AUB recurs, will need further evaluation/management; discussed need for endometrial sampling (maybe in hysteroscopy, D&C given possible polyp on ultrasound). Routine preventative health maintenance measures emphasized. Please refer to After Visit Summary for other counseling recommendations.      Lenoard Rad, MD, FACOG Obstetrician & Gynecologist, [facility] for [employer], [facility]

## 2023-07-17 ENCOUNTER — Ambulatory Visit: Payer: Self-pay | Admitting: Obstetrics & Gynecology

## 2023-07-17 LAB — CYTOLOGY - PAP
Comment: NEGATIVE
Diagnosis: NEGATIVE
High risk HPV: NEGATIVE

## 2023-07-17 LAB — COLOGUARD: COLOGUARD: NEGATIVE

## 2023-07-19 ENCOUNTER — Ambulatory Visit
Admission: RE | Admit: 2023-07-19 | Discharge: 2023-07-19 | Disposition: A | Discharge status: Home / Self Care | Source: Ambulatory Visit | Attending: Obstetrics & Gynecology | Admitting: Obstetrics & Gynecology

## 2023-07-19 DIAGNOSIS — Z1231 Encounter for screening mammogram for malignant neoplasm of breast: Secondary | ICD-10-CM | POA: Insufficient documentation

## 2023-07-29 ENCOUNTER — Other Ambulatory Visit: Payer: Self-pay | Admitting: Family

## 2023-07-29 DIAGNOSIS — G43009 Migraine without aura, not intractable, without status migrainosus: Secondary | ICD-10-CM

## 2023-08-19 ENCOUNTER — Other Ambulatory Visit: Payer: Self-pay | Admitting: Family

## 2023-08-19 DIAGNOSIS — E785 Hyperlipidemia, unspecified: Secondary | ICD-10-CM

## 2023-09-28 ENCOUNTER — Other Ambulatory Visit: Payer: Self-pay | Admitting: Family

## 2023-09-28 DIAGNOSIS — G43009 Migraine without aura, not intractable, without status migrainosus: Secondary | ICD-10-CM

## 2023-09-28 DIAGNOSIS — I10 Essential (primary) hypertension: Secondary | ICD-10-CM

## 2023-11-13 ENCOUNTER — Other Ambulatory Visit: Payer: Self-pay | Admitting: Family

## 2023-11-13 DIAGNOSIS — E785 Hyperlipidemia, unspecified: Secondary | ICD-10-CM

## 2023-11-15 ENCOUNTER — Other Ambulatory Visit: Payer: Self-pay

## 2023-11-15 DIAGNOSIS — E785 Hyperlipidemia, unspecified: Secondary | ICD-10-CM

## 2023-11-15 NOTE — Telephone Encounter (Signed)
 Called pt and scheduled her for fasting labs 12/02/2023 also follow up appointment to see PCP 12/05/2023

## 2023-12-02 ENCOUNTER — Other Ambulatory Visit

## 2023-12-05 ENCOUNTER — Ambulatory Visit: Admitting: Family

## 2023-12-30 ENCOUNTER — Other Ambulatory Visit: Payer: Self-pay | Admitting: Family

## 2023-12-30 DIAGNOSIS — G43009 Migraine without aura, not intractable, without status migrainosus: Secondary | ICD-10-CM

## 2023-12-30 DIAGNOSIS — I10 Essential (primary) hypertension: Secondary | ICD-10-CM

## 2024-01-20 ENCOUNTER — Other Ambulatory Visit: Payer: Self-pay | Admitting: Family

## 2024-01-20 DIAGNOSIS — E785 Hyperlipidemia, unspecified: Secondary | ICD-10-CM

## 2024-01-20 NOTE — Telephone Encounter (Signed)
 LVM to call back to office to schedule appt to recheck cholesterol in order to continue to get refill  lab has been ordered please schedule when pt calls back

## 2024-01-27 ENCOUNTER — Ambulatory Visit: Payer: Self-pay | Admitting: Family

## 2024-01-27 ENCOUNTER — Other Ambulatory Visit

## 2024-01-27 DIAGNOSIS — E785 Hyperlipidemia, unspecified: Secondary | ICD-10-CM | POA: Diagnosis not present

## 2024-01-27 LAB — LIPID PANEL
Cholesterol: 159 mg/dL (ref 0–200)
HDL: 60.5 mg/dL (ref >39.00)
LDL Cholesterol: 87 mg/dL (ref 0–99)
NonHDL: 98.3
Total CHOL/HDL Ratio: 3
Triglycerides: 56 mg/dL (ref 0.0–149.0)
VLDL: 11.2 mg/dL (ref 0.0–40.0)

## 2024-03-18 ENCOUNTER — Other Ambulatory Visit: Payer: Self-pay | Admitting: Family

## 2024-03-18 DIAGNOSIS — E785 Hyperlipidemia, unspecified: Secondary | ICD-10-CM

## 2024-03-22 ENCOUNTER — Other Ambulatory Visit: Payer: Self-pay | Admitting: Family

## 2024-03-22 DIAGNOSIS — G43009 Migraine without aura, not intractable, without status migrainosus: Secondary | ICD-10-CM

## 2024-03-22 DIAGNOSIS — I10 Essential (primary) hypertension: Secondary | ICD-10-CM
